# Patient Record
Sex: Female | Born: 1980 | Race: White | Hispanic: No | Marital: Married | Smoking: Never smoker
Health system: Southern US, Community
[De-identification: ages and names within clinical notes are randomized; demographics above are authoritative.]

## PROBLEM LIST (undated history)

## (undated) DIAGNOSIS — F419 Anxiety disorder, unspecified: Secondary | ICD-10-CM

## (undated) DIAGNOSIS — G4700 Insomnia, unspecified: Secondary | ICD-10-CM

## (undated) DIAGNOSIS — F32A Depression, unspecified: Secondary | ICD-10-CM

## (undated) DIAGNOSIS — F329 Major depressive disorder, single episode, unspecified: Secondary | ICD-10-CM

## (undated) DIAGNOSIS — Z8759 Personal history of other complications of pregnancy, childbirth and the puerperium: Secondary | ICD-10-CM

## (undated) HISTORY — PX: APPENDECTOMY: SHX54

## (undated) HISTORY — DX: Morbid (severe) obesity due to excess calories: E66.01

## (undated) HISTORY — DX: Depression, unspecified: F32.A

## (undated) HISTORY — PX: CHOLECYSTECTOMY: SHX55

## (undated) HISTORY — DX: Insomnia, unspecified: G47.00

## (undated) HISTORY — DX: Anxiety disorder, unspecified: F41.9

## (undated) HISTORY — DX: Major depressive disorder, single episode, unspecified: F32.9

## (undated) HISTORY — DX: Personal history of other complications of pregnancy, childbirth and the puerperium: Z87.59

---

## 1999-04-13 HISTORY — PX: WISDOM TOOTH EXTRACTION: SHX21

## 2008-04-12 HISTORY — PX: OOPHORECTOMY: SHX86

## 2011-03-29 ENCOUNTER — Emergency Department: Payer: Self-pay | Admitting: Emergency Medicine

## 2011-04-15 ENCOUNTER — Emergency Department: Payer: Self-pay | Admitting: Emergency Medicine

## 2011-11-02 ENCOUNTER — Ambulatory Visit (INDEPENDENT_AMBULATORY_CARE_PROVIDER_SITE_OTHER): Payer: BC Managed Care – PPO | Admitting: Obstetrics & Gynecology

## 2011-11-02 ENCOUNTER — Encounter: Payer: Self-pay | Admitting: Obstetrics & Gynecology

## 2011-11-02 VITALS — BP 101/80 | HR 84 | Ht 61.0 in | Wt 228.0 lb

## 2011-11-02 DIAGNOSIS — IMO0002 Reserved for concepts with insufficient information to code with codable children: Secondary | ICD-10-CM | POA: Insufficient documentation

## 2011-11-02 DIAGNOSIS — Z01419 Encounter for gynecological examination (general) (routine) without abnormal findings: Secondary | ICD-10-CM

## 2011-11-02 DIAGNOSIS — Z803 Family history of malignant neoplasm of breast: Secondary | ICD-10-CM

## 2011-11-02 DIAGNOSIS — Z1151 Encounter for screening for human papillomavirus (HPV): Secondary | ICD-10-CM

## 2011-11-02 DIAGNOSIS — Z3009 Encounter for other general counseling and advice on contraception: Secondary | ICD-10-CM

## 2011-11-02 DIAGNOSIS — Z124 Encounter for screening for malignant neoplasm of cervix: Secondary | ICD-10-CM

## 2011-11-02 NOTE — Patient Instructions (Signed)
Preventive Care for Adults, Female A healthy lifestyle and preventive care can promote health and wellness. Preventive health guidelines for women include the following key practices.  A routine yearly physical is a good way to check with your caregiver about your health and preventive screening. It is a chance to share any concerns and updates on your health, and to receive a thorough exam.   Visit your dentist for a routine exam and preventive care every 6 months. Brush your teeth twice a day and floss once a day. Good oral hygiene prevents tooth decay and gum disease.   The frequency of eye exams is based on your age, health, family medical history, use of contact lenses, and other factors. Follow your caregiver's recommendations for frequency of eye exams.   Eat a healthy diet. Foods like vegetables, fruits, whole grains, low-fat dairy products, and lean protein foods contain the nutrients you need without too many calories. Decrease your intake of foods high in solid fats, added sugars, and salt. Eat the right amount of calories for you.Get information about a proper diet from your caregiver, if necessary.   Regular physical exercise is one of the most important things you can do for your health. Most adults should get at least 150 minutes of moderate-intensity exercise (any activity that increases your heart rate and causes you to sweat) each week. In addition, most adults need muscle-strengthening exercises on 2 or more days a week.   Maintain a healthy weight. The body mass index (BMI) is a screening tool to identify possible weight problems. It provides an estimate of body fat based on height and weight. Your caregiver can help determine your BMI, and can help you achieve or maintain a healthy weight.For adults 20 years and older:   A BMI below 18.5 is considered underweight.   A BMI of 18.5 to 24.9 is normal.   A BMI of 25 to 29.9 is considered overweight.   A BMI of 30 and above is  considered obese.   Maintain normal blood lipids and cholesterol levels by exercising and minimizing your intake of saturated fat. Eat a balanced diet with plenty of fruit and vegetables. Blood tests for lipids and cholesterol should begin at age 20 and be repeated every 5 years. If your lipid or cholesterol levels are high, you are over 50, or you are at high risk for heart disease, you may need your cholesterol levels checked more frequently.Ongoing high lipid and cholesterol levels should be treated with medicines if diet and exercise are not effective.   If you smoke, find out from your caregiver how to quit. If you do not use tobacco, do not start.   If you are pregnant, do not drink alcohol. If you are breastfeeding, be very cautious about drinking alcohol. If you are not pregnant and choose to drink alcohol, do not exceed 1 drink per day. One drink is considered to be 12 ounces (355 mL) of beer, 5 ounces (148 mL) of wine, or 1.5 ounces (44 mL) of liquor.   Avoid use of street drugs. Do not share needles with anyone. Ask for help if you need support or instructions about stopping the use of drugs.   High blood pressure causes heart disease and increases the risk of stroke. Your blood pressure should be checked at least every 1 to 2 years. Ongoing high blood pressure should be treated with medicines if weight loss and exercise are not effective.   If you are 55 to 32   years old, ask your caregiver if you should take aspirin to prevent strokes.   Diabetes screening involves taking a blood sample to check your fasting blood sugar level. This should be done once every 3 years, after age 45, if you are within normal weight and without risk factors for diabetes. Testing should be considered at a younger age or be carried out more frequently if you are overweight and have at least 1 risk factor for diabetes.   Breast cancer screening is essential preventive care for women. You should practice "breast  self-awareness." This means understanding the normal appearance and feel of your breasts and may include breast self-examination. Any changes detected, no matter how small, should be reported to a caregiver. Women in their 20s and 30s should have a clinical breast exam (CBE) by a caregiver as part of a regular health exam every 1 to 3 years. After age 40, women should have a CBE every year. Starting at age 40, women should consider having a mammography (breast X-ray test) every year. Women who have a family history of breast cancer should talk to their caregiver about genetic screening. Women at a high risk of breast cancer should talk to their caregivers about having magnetic resonance imaging (MRI) and a mammography every year.   The Pap test is a screening test for cervical cancer. A Pap test can show cell changes on the cervix that might become cervical cancer if left untreated. A Pap test is a procedure in which cells are obtained and examined from the lower end of the uterus (cervix).   Women should have a Pap test starting at age 21.   Between ages 21 and 29, Pap tests should be repeated every 2 years.   Beginning at age 30, you should have a Pap test every 3 years as long as the past 3 Pap tests have been normal.   Some women have medical problems that increase the chance of getting cervical cancer. Talk to your caregiver about these problems. It is especially important to talk to your caregiver if a new problem develops soon after your last Pap test. In these cases, your caregiver may recommend more frequent screening and Pap tests.   The above recommendations are the same for women who have or have not gotten the vaccine for human papillomavirus (HPV).   If you had a hysterectomy for a problem that was not cancer or a condition that could lead to cancer, then you no longer need Pap tests. Even if you no longer need a Pap test, a regular exam is a good idea to make sure no other problems are  starting.   If you are between ages 65 and 70, and you have had normal Pap tests going back 10 years, you no longer need Pap tests. Even if you no longer need a Pap test, a regular exam is a good idea to make sure no other problems are starting.   If you have had past treatment for cervical cancer or a condition that could lead to cancer, you need Pap tests and screening for cancer for at least 20 years after your treatment.   If Pap tests have been discontinued, risk factors (such as a new sexual partner) need to be reassessed to determine if screening should be resumed.   The HPV test is an additional test that may be used for cervical cancer screening. The HPV test looks for the virus that can cause the cell changes on the cervix.   The cells collected during the Pap test can be tested for HPV. The HPV test could be used to screen women aged 30 years and older, and should be used in women of any age who have unclear Pap test results. After the age of 30, women should have HPV testing at the same frequency as a Pap test.   Colorectal cancer can be detected and often prevented. Most routine colorectal cancer screening begins at the age of 50 and continues through age 75. However, your caregiver may recommend screening at an earlier age if you have risk factors for colon cancer. On a yearly basis, your caregiver may provide home test kits to check for hidden blood in the stool. Use of a small camera at the end of a tube, to directly examine the colon (sigmoidoscopy or colonoscopy), can detect the earliest forms of colorectal cancer. Talk to your caregiver about this at age 50, when routine screening begins. Direct examination of the colon should be repeated every 5 to 10 years through age 75, unless early forms of pre-cancerous polyps or small growths are found.   Hepatitis C blood testing is recommended for all people born from 1945 through 1965 and any individual with known risks for hepatitis C.    Practice safe sex. Use condoms and avoid high-risk sexual practices to reduce the spread of sexually transmitted infections (STIs). STIs include gonorrhea, chlamydia, syphilis, trichomonas, herpes, HPV, and human immunodeficiency virus (HIV). Herpes, HIV, and HPV are viral illnesses that have no cure. They can result in disability, cancer, and death. Sexually active women aged 25 and younger should be checked for chlamydia. Older women with new or multiple partners should also be tested for chlamydia. Testing for other STIs is recommended if you are sexually active and at increased risk.   Osteoporosis is a disease in which the bones lose minerals and strength with aging. This can result in serious bone fractures. The risk of osteoporosis can be identified using a bone density scan. Women ages 65 and over and women at risk for fractures or osteoporosis should discuss screening with their caregivers. Ask your caregiver whether you should take a calcium supplement or vitamin D to reduce the rate of osteoporosis.   Menopause can be associated with physical symptoms and risks. Hormone replacement therapy is available to decrease symptoms and risks. You should talk to your caregiver about whether hormone replacement therapy is right for you.   Use sunscreen with sun protection factor (SPF) of 30 or more. Apply sunscreen liberally and repeatedly throughout the day. You should seek shade when your shadow is shorter than you. Protect yourself by wearing long sleeves, pants, a wide-brimmed hat, and sunglasses year round, whenever you are outdoors.   Once a month, do a whole body skin exam, using a mirror to look at the skin on your back. Notify your caregiver of new moles, moles that have irregular borders, moles that are larger than a pencil eraser, or moles that have changed in shape or color.   Stay current with required immunizations.   Influenza. You need a dose every fall (or winter). The composition of  the flu vaccine changes each year, so being vaccinated once is not enough.   Pneumococcal polysaccharide. You need 1 to 2 doses if you smoke cigarettes or if you have certain chronic medical conditions. You need 1 dose at age 65 (or older) if you have never been vaccinated.   Tetanus, diphtheria, pertussis (Tdap, Td). Get 1 dose of   Tdap vaccine if you are younger than age 65, are over 65 and have contact with an infant, are a healthcare worker, are pregnant, or simply want to be protected from whooping cough. After that, you need a Td booster dose every 10 years. Consult your caregiver if you have not had at least 3 tetanus and diphtheria-containing shots sometime in your life or have a deep or dirty wound.   HPV. You need this vaccine if you are a woman age 26 or younger. The vaccine is given in 3 doses over 6 months.   Measles, mumps, rubella (MMR). You need at least 1 dose of MMR if you were born in 1957 or later. You may also need a second dose.   Meningococcal. If you are age 19 to 21 and a first-year college student living in a residence hall, or have one of several medical conditions, you need to get vaccinated against meningococcal disease. You may also need additional booster doses.   Zoster (shingles). If you are age 60 or older, you should get this vaccine.   Varicella (chickenpox). If you have never had chickenpox or you were vaccinated but received only 1 dose, talk to your caregiver to find out if you need this vaccine.   Hepatitis A. You need this vaccine if you have a specific risk factor for hepatitis A virus infection or you simply wish to be protected from this disease. The vaccine is usually given as 2 doses, 6 to 18 months apart.   Hepatitis B. You need this vaccine if you have a specific risk factor for hepatitis B virus infection or you simply wish to be protected from this disease. The vaccine is given in 3 doses, usually over 6 months.  Preventive Services /  Frequency Ages 19 to 39  Blood pressure check.** / Every 1 to 2 years.   Lipid and cholesterol check.** / Every 5 years beginning at age 20.   Clinical breast exam.** / Every 3 years for women in their 20s and 30s.   Pap test.** / Every 2 years from ages 21 through 29. Every 3 years starting at age 30 through age 65 or 70 with a history of 3 consecutive normal Pap tests.   HPV screening.** / Every 3 years from ages 30 through ages 65 to 70 with a history of 3 consecutive normal Pap tests.   Hepatitis C blood test.** / For any individual with known risks for hepatitis C.   Skin self-exam. / Monthly.   Influenza immunization.** / Every year.   Pneumococcal polysaccharide immunization.** / 1 to 2 doses if you smoke cigarettes or if you have certain chronic medical conditions.   Tetanus, diphtheria, pertussis (Tdap, Td) immunization. / A one-time dose of Tdap vaccine. After that, you need a Td booster dose every 10 years.   HPV immunization. / 3 doses over 6 months, if you are 26 and younger.   Measles, mumps, rubella (MMR) immunization. / You need at least 1 dose of MMR if you were born in 1957 or later. You may also need a second dose.   Meningococcal immunization. / 1 dose if you are age 19 to 21 and a first-year college student living in a residence hall, or have one of several medical conditions, you need to get vaccinated against meningococcal disease. You may also need additional booster doses.   Varicella immunization.** / Consult your caregiver.   Hepatitis A immunization.** / Consult your caregiver. 2 doses, 6 to 18 months   apart.   Hepatitis B immunization.** / Consult your caregiver. 3 doses usually over 6 months.    ** Family history and personal history of risk and conditions may change your caregiver's recommendations. Document Released: 05/25/2001 Document Revised: 03/18/2011 Document Reviewed: 08/24/2010 [employer] Patient Information 2012 [employer]. 

## 2011-11-02 NOTE — Progress Notes (Signed)
Patient is here for routine yearly exam, she is interested BRCA and birth control.

## 2011-11-02 NOTE — Progress Notes (Signed)
  Subjective:     Karen Bolton is a 31 y.o. G0 female and is here for a comprehensive gynecologic physical exam. The patient reports no problems. Wanted to know if she can get a mammogram given extensive FH of breast cancer, mother diagnosed at age 67, cousins in their 39s. Also wants Mirena IUD for contraception.  History   Social History  . Marital Status: Married    Spouse Name: N/A    Number of Children: N/A  . Years of Education: N/A   Occupational History  . Not on file.   Social History Main Topics  . Smoking status: Never Smoker   . Smokeless tobacco: Not on file  . Alcohol Use: Yes     occasionally  . Drug Use: No  . Sexually Active: Yes -- Female partner(s)    Birth Control/ Protection: Condom   Other Topics Concern  . Not on file   Social History Narrative  . No narrative on file   No health maintenance topics applied.  The following portions of the patient's history were reviewed and updated as appropriate: allergies, current medications, past family history, past medical history, past social history, past surgical history and problem list.  Review of Systems A comprehensive review of systems was negative.   Objective:   Blood pressure 101/80, pulse 84, height 5\' 1"  (1.549 m), weight 228 lb (103.42 kg), last menstrual period 09/28/2011. GENERAL: Well-developed, well-nourished female in no acute distress.  HEENT: Normocephalic, atraumatic. Sclerae anicteric.  NECK: Supple. Normal thyroid.  LUNGS: Clear to auscultation bilaterally.  HEART: Regular rate and rhythm. BREASTS: Symmetric, pendulous with everted nipples. No masses, skin changes, nipple drainage, or lymphadenopathy. ABDOMEN: Soft, nontender, nondistended. No organomegaly. PELVIC: Normal external female genitalia. Vagina is pink and rugated.  Slow trickle of blood notes, patient due for her menstrual period. Normal cervix contour. Pap smear obtained. Uterus is normal in size. No adnexal mass or  tenderness.  EXTREMITIES: No cyanosis, clubbing, or edema, 2+ distal pulses.   Assessment:    Healthy female exam.      Plan:    Follow up pap smear [facility], not due for mammogram until age 47.  She filled out paperwork for genetic testing, will follow up and manage accordingly Will refer to [facility] for primary care Return this week for Mirena IUD insertion Routine preventative health maintenance measures emphasized

## 2011-11-04 ENCOUNTER — Encounter: Payer: Self-pay | Admitting: Obstetrics & Gynecology

## 2011-11-04 ENCOUNTER — Ambulatory Visit (INDEPENDENT_AMBULATORY_CARE_PROVIDER_SITE_OTHER): Payer: BC Managed Care – PPO | Admitting: Obstetrics & Gynecology

## 2011-11-04 VITALS — BP 93/69 | HR 77 | Ht 61.0 in | Wt 227.0 lb

## 2011-11-04 DIAGNOSIS — Z30430 Encounter for insertion of intrauterine contraceptive device: Secondary | ICD-10-CM

## 2011-11-04 DIAGNOSIS — Z01812 Encounter for preprocedural laboratory examination: Secondary | ICD-10-CM

## 2011-11-04 DIAGNOSIS — Z975 Presence of (intrauterine) contraceptive device: Secondary | ICD-10-CM | POA: Insufficient documentation

## 2011-11-04 LAB — POCT URINE PREGNANCY: Preg Test, Ur: NEGATIVE

## 2011-11-04 NOTE — Progress Notes (Signed)
Patient is here for insertion of Mirena IUD and BRCA testing due to strong family history of breast and ovarian cancer.

## 2011-11-04 NOTE — Progress Notes (Signed)
IUD Procedure Note Patient identified, informed consent performed.  Discussed risks of irregular bleeding, cramping, infection, malpositioning or misplacement of the IUD outside the uterus which may require further procedures. Time out was performed.  Urine pregnancy test negative.  Speculum placed in the vagina.  Cervix visualized.  Cleaned with Betadine x 2.  Grasped anteriorly with a single tooth tenaculum.  Uterus sounded to7 cm.  Mirena IUD placed per manufacturer's recommendations.  Strings trimmed to 3 cm. Tenaculum was removed, good hemostasis noted.  Patient tolerated procedure well.   Patient was given post-procedure instructions and the Mirena care card with expiration date.  Patient was also asked to check IUD strings periodically and follow up in 4-6 weeks for IUD check.  Finian Helvey L. Harraway-Smith, M.D., [facility]

## 2011-11-04 NOTE — Patient Instructions (Signed)

## 2011-11-10 ENCOUNTER — Encounter: Payer: Self-pay | Admitting: Obstetrics & Gynecology

## 2011-11-11 ENCOUNTER — Emergency Department: Payer: Self-pay | Admitting: Emergency Medicine

## 2011-12-02 ENCOUNTER — Ambulatory Visit: Payer: BC Managed Care – PPO | Admitting: Obstetrics & Gynecology

## 2012-01-29 ENCOUNTER — Emergency Department: Payer: Self-pay | Admitting: Emergency Medicine

## 2012-01-31 ENCOUNTER — Emergency Department: Payer: Self-pay | Admitting: Emergency Medicine

## 2012-03-08 ENCOUNTER — Emergency Department: Payer: Self-pay | Admitting: Emergency Medicine

## 2012-03-16 ENCOUNTER — Ambulatory Visit: Payer: BC Managed Care – PPO | Admitting: Obstetrics & Gynecology

## 2012-03-18 ENCOUNTER — Encounter (HOSPITAL_COMMUNITY): Payer: Self-pay | Admitting: Emergency Medicine

## 2012-03-18 ENCOUNTER — Emergency Department (HOSPITAL_COMMUNITY)
Admission: EM | Admit: 2012-03-18 | Discharge: 2012-03-18 | Disposition: A | Discharge status: Home / Self Care | Payer: BC Managed Care – PPO | Attending: Emergency Medicine | Admitting: Emergency Medicine

## 2012-03-18 DIAGNOSIS — K0889 Other specified disorders of teeth and supporting structures: Secondary | ICD-10-CM

## 2012-03-18 DIAGNOSIS — K089 Disorder of teeth and supporting structures, unspecified: Secondary | ICD-10-CM | POA: Insufficient documentation

## 2012-03-18 DIAGNOSIS — Z79899 Other long term (current) drug therapy: Secondary | ICD-10-CM | POA: Insufficient documentation

## 2012-03-18 DIAGNOSIS — Z87898 Personal history of other specified conditions: Secondary | ICD-10-CM | POA: Insufficient documentation

## 2012-03-18 MED ORDER — CLINDAMYCIN HCL 150 MG PO CAPS: 150.0000 mg | ORAL_CAPSULE | Freq: Three times a day (TID) | ORAL | Status: DC

## 2012-03-18 MED ORDER — OXYCODONE-ACETAMINOPHEN 5-325 MG PO TABS: 1.0000 | ORAL_TABLET | ORAL | Status: DC | PRN

## 2012-03-18 NOTE — ED Notes (Signed)
Pt. Stated, I've had a toothache since last night.

## 2012-03-18 NOTE — ED Provider Notes (Signed)
History     CSN: [id number]  Arrival date & time 03/18/12  1218   First MD Initiated Contact with Patient 03/18/12 1236      Chief Complaint  Patient presents with  . Dental Pain    (Consider location/radiation/quality/duration/timing/severity/associated sxs/prior treatment) HPI Comments: Pt states that she broke her tooth last night and now she is having a lot of left upper dental pain  Patient is a 31 y.o. female presenting with tooth pain. No language interpreter was used.  Dental PainThe primary symptoms include mouth pain. The symptoms began 12 to 24 hours ago. The symptoms are unchanged. The symptoms occur constantly.    Past Medical History  Diagnosis Date  . Insomnia     Past Surgical History  Procedure Date  . Oophorectomy 2010    right ovary removed    Family History  Problem Relation Age of Onset  . Cancer Mother 42    Breast Cancer  . Cancer Maternal Aunt 35    ovarian Cancer  . Cancer Maternal Uncle     pancreatic cancer    History  Substance Use Topics  . Smoking status: Never Smoker   . Smokeless tobacco: Not on file  . Alcohol Use: Yes     Comment: occasionally    OB History    Grav Para Term Preterm Abortions TAB SAB Ect Mult Living                  Review of Systems  Constitutional: Negative.   Respiratory: Negative.   Cardiovascular: Negative.     Allergies  Penicillins and Dilaudid  Home Medications   Current Outpatient Rx  Name  Route  Sig  Dispense  Refill  . CARBAMAZEPINE 100 MG PO CHEW   Oral   Chew 100 mg by mouth 2 (two) times daily.         Marland Kitchen CLONAZEPAM 1 MG PO TABS   Oral   Take 1 mg by mouth 2 (two) times daily.         . QUETIAPINE FUMARATE 100 MG PO TABS   Oral   Take 100 mg by mouth at bedtime.           BP 114/59  Pulse 90  Temp 97.5 F (36.4 C) (Oral)  Resp 16  SpO2 98%  LMP 03/09/2012  Physical Exam  Nursing note and vitals reviewed. Constitutional: She appears well-developed and  well-nourished.  HENT:       Pt has fracture noted to left upper dentition:no swelling noted  Cardiovascular: Normal rate and regular rhythm.   Pulmonary/Chest: Effort normal and breath sounds normal.    ED Course  Procedures (including critical care time)  Labs Reviewed - No data to display No results found.   1. Toothache       MDM  Pt given antibiotic and pain medication and referral to dentist        Teressa Lower, NP 03/18/12 1252

## 2012-03-19 ENCOUNTER — Emergency Department: Payer: Self-pay | Admitting: Emergency Medicine

## 2012-03-19 LAB — URINALYSIS, COMPLETE
Bilirubin,UR: NEGATIVE
Glucose,UR: NEGATIVE mg/dL (ref 0–75)
Leukocyte Esterase: NEGATIVE
Nitrite: NEGATIVE
RBC,UR: 16 /HPF (ref 0–5)

## 2012-03-19 LAB — CBC
Platelet: 258 10*3/uL (ref 150–440)
RBC: 4.71 10*6/uL (ref 3.80–5.20)

## 2012-03-19 LAB — COMPREHENSIVE METABOLIC PANEL
Anion Gap: 5 — ABNORMAL LOW (ref 7–16)
BUN: 13 mg/dL (ref 7–18)
Bilirubin,Total: 0.5 mg/dL (ref 0.2–1.0)
Chloride: 107 mmol/L (ref 98–107)
Creatinine: 0.81 mg/dL (ref 0.60–1.30)
EGFR (Non-African Amer.): >60
Osmolality: 276 (ref 275–301)
Potassium: 3.8 mmol/L (ref 3.5–5.1)
SGOT(AST): 28 U/L (ref 15–37)
Sodium: 138 mmol/L (ref 136–145)
Total Protein: 8.1 g/dL (ref 6.4–8.2)

## 2012-03-19 LAB — DRUG SCREEN, URINE
Barbiturates, Ur Screen: NEGATIVE (ref ?–200)
Benzodiazepine, Ur Scrn: NEGATIVE (ref ?–200)
MDMA (Ecstasy)Ur Screen: NEGATIVE (ref ?–500)
Methadone, Ur Screen: NEGATIVE (ref ?–300)
Opiate, Ur Screen: NEGATIVE (ref ?–300)

## 2012-03-19 LAB — ACETAMINOPHEN LEVEL: Acetaminophen: <2 ug/mL

## 2012-03-19 LAB — ETHANOL: Ethanol %: <0.003 % (ref 0.000–0.080)

## 2012-03-19 LAB — PREGNANCY, URINE: Pregnancy Test, Urine: NEGATIVE m[IU]/mL

## 2012-03-19 NOTE — ED Provider Notes (Signed)
Medical screening examination/treatment/procedure(s) were performed by non-physician practitioner and as supervising physician I was immediately available for consultation/collaboration.   Laray Anger, DO 03/19/12 1249

## 2012-03-21 ENCOUNTER — Emergency Department: Payer: Self-pay | Admitting: Emergency Medicine

## 2012-03-21 ENCOUNTER — Ambulatory Visit: Payer: BC Managed Care – PPO | Admitting: Obstetrics & Gynecology

## 2012-03-21 DIAGNOSIS — Z30431 Encounter for routine checking of intrauterine contraceptive device: Secondary | ICD-10-CM

## 2012-03-21 LAB — URINALYSIS, COMPLETE
Bilirubin,UR: NEGATIVE
Glucose,UR: NEGATIVE mg/dL (ref 0–75)
Leukocyte Esterase: NEGATIVE
RBC,UR: <1 /HPF (ref 0–5)
Squamous Epithelial: 2
WBC UR: 1 /HPF (ref 0–5)

## 2012-04-15 ENCOUNTER — Emergency Department: Payer: Self-pay | Admitting: Emergency Medicine

## 2012-04-15 LAB — URINALYSIS, COMPLETE
Bilirubin,UR: NEGATIVE
Leukocyte Esterase: NEGATIVE
Nitrite: NEGATIVE
WBC UR: <1 /HPF (ref 0–5)

## 2012-05-05 ENCOUNTER — Ambulatory Visit: Payer: BC Managed Care – PPO | Admitting: Family Medicine

## 2012-05-09 ENCOUNTER — Ambulatory Visit (INDEPENDENT_AMBULATORY_CARE_PROVIDER_SITE_OTHER): Payer: BC Managed Care – PPO | Admitting: Obstetrics & Gynecology

## 2012-05-09 ENCOUNTER — Encounter: Payer: Self-pay | Admitting: Obstetrics & Gynecology

## 2012-05-09 VITALS — BP 110/77 | HR 82 | Ht 61.0 in | Wt 227.0 lb

## 2012-05-09 DIAGNOSIS — Z30432 Encounter for removal of intrauterine contraceptive device: Secondary | ICD-10-CM

## 2012-05-09 DIAGNOSIS — Z3169 Encounter for other general counseling and advice on procreation: Secondary | ICD-10-CM

## 2012-05-09 DIAGNOSIS — Z975 Presence of (intrauterine) contraceptive device: Secondary | ICD-10-CM

## 2012-05-09 DIAGNOSIS — Z803 Family history of malignant neoplasm of breast: Secondary | ICD-10-CM

## 2012-05-09 DIAGNOSIS — IMO0002 Reserved for concepts with insufficient information to code with codable children: Secondary | ICD-10-CM

## 2012-05-09 DIAGNOSIS — Z719 Counseling, unspecified: Secondary | ICD-10-CM

## 2012-05-09 MED ORDER — PRENATAL CA CARB-B6-B12-FA 1 MG PO TABS: 1.0000 | ORAL_TABLET | Freq: Every day | ORAL | Status: DC

## 2012-05-09 NOTE — Patient Instructions (Signed)
Preparing for Pregnancy  Preparing for pregnancy (preconceptual care) by getting counseling and information from your caregiver before getting pregnant is a good idea. It will help you and your baby have a better chance to have a healthy, safe pregnancy and delivery of your baby. Make an appointment with your caregiver to talk about your health, medical, and family history and how to prepare yourself before getting pregnant. Your caregiver will do a complete physical exam and a Pap test. They will want to know:  · About you, your spouse or partner, and your family's medical and genetic history.  · If you are eating a balanced diet and drinking enough fluids.  · What vitamins and mineral supplements you are taking. This includes taking folic acid before getting pregnant to help prevent birth defects.  · What medications you are taking including prescription, over-the-counter and herbal medications.  · If there is any substance abuse like alcohol, smoking, and illegal drugs.  · If there is any mental or physical domestic violence.  · If there is any risk of sexually transmitted disease between you and your partner.  · What immunizations and vaccinations you have had and what you may need before getting pregnant.  · If you should get tested for HIV infection.  · If there is any exposure to chemical or toxic substances at home or work.  · If there are medical problems you have that need to be treated and kept under control before getting pregnant such as diabetes, high blood pressure or others.  · If there were any past surgeries, pregnancies and problems with them.  · What your current weight is and to set a goal as to how much weight you should gain while pregnant. Also, they will check if you should lose or gain weight before getting pregnant.  · What is your exercise routine and what it is safe when you are pregnant.  · If there are any physical disabilities that need to be addressed.  · About spacing your  pregnancies when there are other children.  · If there is a financial problem that may affect you having a child.  After talking about the above points with your caregiver, your caregiver will give you advice on how to help treat and work with you on solving any issues, if necessary, before getting pregnant. The goal is to have a healthy and safe pregnancy for you and your baby. You should keep an accurate record of your menstrual periods because it will help in determining your due date.  Immunizations that you should have before getting pregnant:   · Regular measles, German measles (rubella) and mumps.  · Tetanus and diphtheria.  · Chickenpox, if not immune.  · Herpes zoster (Varicella) if not immune.  · Human papilloma virus vaccine (HPV) between the age of 9 and 26 years old.  · Hepatitis A vaccine.  · Hepatitis B vaccine.  · Influenza vaccine.  · Pneumococcal vaccine (pneumonia).  You should avoid getting pregnant for one month after getting vaccinated with a live virus vaccine such as German measles (rubella) vaccine. Other immunizations may be necessary depending on where you live, such as malaria. Ask your caregiver if any other immunizations are needed for you.  HOME CARE INSTRUCTIONS   · Follow the advice of your caregiver.  · Before getting pregnant:  · Begin taking vitamins, supplements, and 0.4 milligrams folic acid daily.  · Get your immunizations up-to-date.  · Get help from a nutrition counselor   if you do not understand what a balanced diet is, need help with a special medical diet or if you need help to lose or gain weight.  · Begin exercising.  · Stop smoking, taking illegal drugs, and drinking alcoholic beverages.  · Get counseling if there is and type of domestic violence.  · Get checked for sexually transmitted diseases including HIV.  · Get any medical problems under control (diabetes, high blood pressure, convulsions, asthma or others).  · Resolve any financial concerns.  · Be sure you and  your spouse or partner are ready to have a baby.  · Keep an accurate record of your menstrual periods.  Document Released: 03/11/2008 Document Revised: 06/21/2011 Document Reviewed: 03/11/2008  [employer]® Patient Information ©2013 [employer].

## 2012-05-09 NOTE — Progress Notes (Signed)
Karen Bolton is a 32 y.o. G2P0020 here for Mirena IUD removal. No GYN concerns.  Last pap smear was on 11/09/12 and was normal but positive HPV.   BP 110/77  Pulse 82  Ht 5\' 1"  (1.549 m)  Wt 227 lb (102.967 kg)  BMI 42.89 kg/m2  LMP 05/01/2012 IUD Removal  Patient was placed in the dorsal lithotomy position, normal external genitalia was noted.  A speculum was placed in the patient's vagina, normal discharge was noted, no lesions. The nulliparous cervix was visualized, no lesions, no abnormal discharge. The strings of the IUD were grasped and pulled using ring forceps.  The IUD was successfully removed in its entirety. Patient tolerated the procedure well.    Patient wants to conceive soon. She is on various psychotropic medications and is under the care of a psychiatrist who will modify/discontinue these medications when she gets pregnant.  Preconceptual connseling given, also prescribed PNV. She was told it can take up to six months after Mirena removal to conceive, if no conception after that, she was told to use ovulation predictor kits or come in for further evaluation.  Patient reports extensive family history of breast cancer, including her mother who was diagnosed at age 73.  Genetic counseling about breast cancer given, patient interested in getting screening.  She will obtain the test today.  Total encounter time: 25 minutes

## 2012-05-12 ENCOUNTER — Ambulatory Visit (INDEPENDENT_AMBULATORY_CARE_PROVIDER_SITE_OTHER): Payer: BC Managed Care – PPO | Admitting: Family Medicine

## 2012-05-12 VITALS — BP 124/80 | Ht 61.0 in | Wt 227.0 lb

## 2012-05-12 DIAGNOSIS — M549 Dorsalgia, unspecified: Secondary | ICD-10-CM

## 2012-05-12 DIAGNOSIS — G8929 Other chronic pain: Secondary | ICD-10-CM
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
## 2012-05-19 ENCOUNTER — Emergency Department (HOSPITAL_COMMUNITY)
Admission: EM | Admit: 2012-05-19 | Discharge: 2012-05-20 | Disposition: A | Discharge status: Home / Self Care | Payer: BC Managed Care – PPO | Attending: Emergency Medicine | Admitting: Emergency Medicine

## 2012-05-19 DIAGNOSIS — E669 Obesity, unspecified: Secondary | ICD-10-CM | POA: Insufficient documentation

## 2012-05-19 DIAGNOSIS — Z79899 Other long term (current) drug therapy: Secondary | ICD-10-CM | POA: Insufficient documentation

## 2012-05-19 DIAGNOSIS — F329 Major depressive disorder, single episode, unspecified: Secondary | ICD-10-CM | POA: Insufficient documentation

## 2012-05-19 DIAGNOSIS — Y939 Activity, unspecified: Secondary | ICD-10-CM | POA: Insufficient documentation

## 2012-05-19 DIAGNOSIS — Y929 Unspecified place or not applicable: Secondary | ICD-10-CM | POA: Insufficient documentation

## 2012-05-19 DIAGNOSIS — F3289 Other specified depressive episodes: Secondary | ICD-10-CM | POA: Insufficient documentation

## 2012-05-19 DIAGNOSIS — Z8742 Personal history of other diseases of the female genital tract: Secondary | ICD-10-CM | POA: Insufficient documentation

## 2012-05-19 DIAGNOSIS — S53124A Posterior dislocation of right ulnohumeral joint, initial encounter: Secondary | ICD-10-CM

## 2012-05-19 DIAGNOSIS — Z87898 Personal history of other specified conditions: Secondary | ICD-10-CM | POA: Insufficient documentation

## 2012-05-19 DIAGNOSIS — S53106A Unspecified dislocation of unspecified ulnohumeral joint, initial encounter: Secondary | ICD-10-CM | POA: Insufficient documentation

## 2012-05-19 DIAGNOSIS — W19XXXA Unspecified fall, initial encounter: Secondary | ICD-10-CM | POA: Insufficient documentation

## 2012-05-20 ENCOUNTER — Emergency Department (HOSPITAL_COMMUNITY): Payer: BC Managed Care – PPO

## 2012-05-20 MED ORDER — ONDANSETRON HCL 4 MG/2ML IJ SOLN
INTRAMUSCULAR | Status: AC
  Filled 2012-05-20: qty 2

## 2012-05-20 MED ORDER — PROPOFOL 10 MG/ML IV BOLUS
INTRAVENOUS | Status: AC
  Filled 2012-05-20: qty 20

## 2012-05-20 MED ORDER — OXYCODONE-ACETAMINOPHEN 5-325 MG PO TABS: 1.0000 | ORAL_TABLET | ORAL | Status: DC | PRN

## 2012-05-20 MED ORDER — FENTANYL CITRATE 0.05 MG/ML IJ SOLN
INTRAMUSCULAR | Status: AC
  Filled 2012-05-20: qty 2

## 2012-05-20 MED ORDER — KETOROLAC TROMETHAMINE 30 MG/ML IJ SOLN
30.0000 mg | Freq: Once | INTRAMUSCULAR | Status: AC
  Administered 2012-05-20: 30 mg via INTRAVENOUS
  Filled 2012-05-20: qty 1

## 2012-05-20 MED ORDER — LORAZEPAM 2 MG/ML IJ SOLN
1.0000 mg | Freq: Once | INTRAMUSCULAR | Status: AC
  Administered 2012-05-20: 1 mg via INTRAVENOUS
  Filled 2012-05-20: qty 1

## 2012-05-20 MED ORDER — ONDANSETRON HCL 4 MG/2ML IJ SOLN
INTRAMUSCULAR | Status: AC
  Administered 2012-05-20: 4 mg
  Filled 2012-05-20: qty 2

## 2012-05-20 MED ORDER — FENTANYL CITRATE 0.05 MG/ML IJ SOLN
50.0000 ug | Freq: Once | INTRAMUSCULAR | Status: AC
  Administered 2012-05-20: 50 ug via INTRAVENOUS
  Filled 2012-05-20: qty 2

## 2012-05-20 NOTE — ED Notes (Signed)
Previous paper charting done during downtime.

## 2012-05-20 NOTE — ED Notes (Signed)
Given Rx x1, out to d/c desk, out with RN to car, family x3 present, alert, NAD,calm, interactive, VSS.

## 2012-05-20 NOTE — ED Notes (Addendum)
Pt alert, NAD, calm, interactive, resps e/u, speaking in clear complete sentences, coughs on command, swallows water w/o difficulty, speakking with pharm tech at this time, meds given IV for pain, spasms, nausea and inflammation. States, "ready to go home". R foot IV site Unremarkable, cath intact.

## 2012-05-20 NOTE — ED Provider Notes (Signed)
History    32 year old female with right elbow pain. Patient sustained a mechanical fall at home onto her right elbow. Onset shortly before arrival. Acute onset of pain. Constant pain since then. No other complaints. Does not think she hit her head. Denies headache, neck or back pain. No numbness, tingling or loss of strength.   CSN: [id number]  Arrival date & time 05/19/12  2352   First MD Initiated Contact with Patient 05/20/12 0457      No chief complaint on file.   (Consider location/radiation/quality/duration/timing/severity/associated sxs/prior treatment) HPI  Past Medical History  Diagnosis Date  . Insomnia   . History of miscarriage     x2  . Anxiety   . Depression     Past Surgical History  Procedure Laterality Date  . Oophorectomy  2010    right ovary removed  . Cholecystectomy    . Wisdom tooth extraction      Family History  Problem Relation Age of Onset  . Cancer Mother 14    Breast Cancer  . Cancer Maternal Aunt 35    ovarian Cancer  . Cancer Maternal Uncle     pancreatic cancer  . Heart disease Father   . Diabetes Father   . Sleep apnea Father   . Cancer Paternal Grandmother     breast cancer  . Cancer Cousin     breast cancer    History  Substance Use Topics  . Smoking status: Never Smoker   . Smokeless tobacco: Not on file  . Alcohol Use: Yes     Comment: rare.    OB History   Grav Para Term Preterm Abortions TAB SAB Ect Mult Living   2 0 0 0 2 0 2 0 0 0       Review of Systems  All systems reviewed and negative, other than as noted in HPI.  Allergies  Penicillins and Dilaudid  Home Medications   Current Outpatient Rx  Name  Route  Sig  Dispense  Refill  . amphetamine-dextroamphetamine (ADDERALL XR) 25 MG 24 hr capsule               . clonazePAM (KLONOPIN) 1 MG tablet   Oral   Take 1 mg by mouth 2 (two) times daily as needed (usually to help with sleep).          . Prenatal Ca Carb-B6-B12-FA 1 MG TABS   Oral  Take 1 tablet (1 mg total) by mouth daily.   30 each   12   . QUEtiapine (SEROQUEL) 100 MG tablet   Oral   Take 100 mg by mouth at bedtime.         Marland Kitchen oxyCODONE-acetaminophen (PERCOCET/ROXICET) 5-325 MG per tablet   Oral   Take 1-2 tablets by mouth every 4 (four) hours as needed for pain.   20 tablet   0     BP 133/93  Pulse 116  Temp(Src) 98.1 F (36.7 C) (Oral)  Resp 18  SpO2 97%  LMP 05/01/2012  Physical Exam  Nursing note and vitals reviewed. Constitutional: She appears well-developed and well-nourished. No distress.  HENT:  Head: Normocephalic and atraumatic.  Eyes: Conjunctivae are normal. Right eye exhibits no discharge. Left eye exhibits no discharge.  Neck: Neck supple.  Cardiovascular: Normal rate, regular rhythm and normal heart sounds.  Exam reveals no gallop and no friction rub.   No murmur heard. Pulmonary/Chest: Effort normal and breath sounds normal. No respiratory distress.  Abdominal: Soft. She exhibits no distension.  There is no tenderness.  Musculoskeletal: She exhibits no edema and no tenderness.  Diffuse tenderness of the right elbow. Olecranon appears to be felt posteriorly displaced on palpation. Patient unable to range her elbow secondary to pain. Neurovascular intact distally. No abrasions or lacerations.  Neurological: She is alert.  Skin: Skin is warm and dry.  Psychiatric: She has a normal mood and affect. Her behavior is normal. Thought content normal.    ED Course  Reduction of dislocation Date/Time: 05/20/2012 7:56 AM Performed by: Raeford Razor Authorized by: Raeford Razor Consent: Verbal consent obtained. Risks and benefits: risks, benefits and alternatives were discussed Consent given by: patient Required items: required blood products, implants, devices, and special equipment available Patient identity confirmed: verbally with patient, provided demographic data and arm band Time out: Immediately prior to procedure a "time out"  was called to verify the correct patient, procedure, equipment, support staff and site/side marked as required. Local anesthesia used: no Patient sedated: yes Sedation type: moderate (conscious) sedation Sedatives: propofol and ketamine Analgesia: fentanyl Vitals: Vital signs were monitored during sedation. Patient tolerance: Patient tolerated the procedure well with no immediate complications. Comments: Closed reduction of R posterior elbow dislocation.   Procedural sedation Date/Time: 05/20/2012 3:00 AM Performed by: Raeford Razor Authorized by: Raeford Razor Consent: written consent obtained. Risks and benefits: risks, benefits and alternatives were discussed Required items: required blood products, implants, devices, and special equipment available Patient identity confirmed: verbally with patient, arm band and provided demographic data Time out: Immediately prior to procedure a "time out" was called to verify the correct patient, procedure, equipment, support staff and site/side marked as required. Local anesthesia used: no Patient sedated: yes Sedation type: moderate (conscious) sedation Sedatives: propofol and ketamine Analgesia: fentanyl Vitals: Vital signs were monitored during sedation. Patient tolerance: Patient tolerated the procedure well with no immediate complications. Comments: Approximately 35 minutes of sedation time/monitoring   Preprocedure  Pre-anesthesia/induction confirmation of laterality/correct procedure site including "time-out."  Provider confirms review of the nurses' note, allergies, medications, pertinent labs, PMH, pre-induction vital signs, pulse oximetry, pain level, and ECG (as applicable), and patient condition satisfactory for commencing with order for sedation and procedure.  Patient tolerated procedure and procedural sedation component as expected without apparent immediate complications.  Physician confirms procedural medication orders as  administered, patient was assessed by physician post-procedure, and confirms post-sedation plan of care and disposition.   (including critical care time)  Labs Reviewed - No data to display No results found.   1. Dislocation of elbow, posterior, right, closed       MDM  32 year old female with right elbow pain. Imaging significant for a posterior right elbow dislocation. Closed injury. This was reduced satisfactorily. Neurovascular intact post procedure. Patient was placed in a sling. As necessary pain medication. Orthopedic followup.        Raeford Razor, MD 05/20/12 [phone]

## 2012-05-22 ENCOUNTER — Telehealth (HOSPITAL_COMMUNITY): Payer: Self-pay | Admitting: Emergency Medicine

## 2012-07-21 ENCOUNTER — Emergency Department: Payer: Self-pay | Admitting: Emergency Medicine

## 2012-07-27 ENCOUNTER — Emergency Department: Payer: Self-pay | Admitting: Emergency Medicine

## 2012-07-27 LAB — COMPREHENSIVE METABOLIC PANEL
Albumin: 3.7 g/dL (ref 3.4–5.0)
Anion Gap: 5 — ABNORMAL LOW (ref 7–16)
BUN: 17 mg/dL (ref 7–18)
Calcium, Total: 9.3 mg/dL (ref 8.5–10.1)
Chloride: 107 mmol/L (ref 98–107)
Co2: 25 mmol/L (ref 21–32)
Creatinine: 0.63 mg/dL (ref 0.60–1.30)
EGFR (African American): >60
EGFR (Non-African Amer.): >60
SGOT(AST): 36 U/L (ref 15–37)
Total Protein: 8 g/dL (ref 6.4–8.2)

## 2012-07-27 LAB — HCG, QUANTITATIVE, PREGNANCY: Beta Hcg, Quant.: <1 m[IU]/mL — ABNORMAL LOW

## 2012-07-27 LAB — CBC
HGB: 13.9 g/dL (ref 12.0–16.0)
MCHC: 33.9 g/dL (ref 32.0–36.0)
Platelet: 324 10*3/uL (ref 150–440)

## 2012-07-27 LAB — URINALYSIS, COMPLETE
Bacteria: NONE SEEN
Ketone: NEGATIVE
Ph: 5 (ref 4.5–8.0)
Protein: NEGATIVE
Squamous Epithelial: 18
WBC UR: 2 /HPF (ref 0–5)

## 2012-07-31 ENCOUNTER — Encounter: Payer: Self-pay | Admitting: *Deleted

## 2012-08-01 ENCOUNTER — Ambulatory Visit: Payer: BC Managed Care – PPO | Admitting: Family Medicine

## 2012-08-03 ENCOUNTER — Ambulatory Visit: Payer: BC Managed Care – PPO | Admitting: Obstetrics & Gynecology

## 2012-08-03 DIAGNOSIS — N83209 Unspecified ovarian cyst, unspecified side: Secondary | ICD-10-CM

## 2012-08-10 ENCOUNTER — Emergency Department: Payer: Self-pay | Admitting: Emergency Medicine

## 2012-08-10 LAB — URINALYSIS, COMPLETE
Bacteria: NONE SEEN
Ketone: NEGATIVE
Leukocyte Esterase: NEGATIVE
Nitrite: NEGATIVE
Ph: 6 (ref 4.5–8.0)
RBC,UR: 19 /HPF (ref 0–5)
Squamous Epithelial: 3
WBC UR: 1 /HPF (ref 0–5)

## 2012-08-10 LAB — CBC
HGB: 13.1 g/dL (ref 12.0–16.0)
MCH: 27.7 pg (ref 26.0–34.0)
MCHC: 34.4 g/dL (ref 32.0–36.0)
MCV: 81 fL (ref 80–100)
Platelet: 251 10*3/uL (ref 150–440)
RBC: 4.71 10*6/uL (ref 3.80–5.20)

## 2012-08-10 LAB — COMPREHENSIVE METABOLIC PANEL
Calcium, Total: 9.6 mg/dL (ref 8.5–10.1)
Co2: 26 mmol/L (ref 21–32)
Creatinine: 0.76 mg/dL (ref 0.60–1.30)
EGFR (African American): >60
Osmolality: 280 (ref 275–301)
Potassium: 3.8 mmol/L (ref 3.5–5.1)
SGPT (ALT): 40 U/L (ref 12–78)
Total Protein: 7.7 g/dL (ref 6.4–8.2)

## 2012-09-25 ENCOUNTER — Ambulatory Visit (INDEPENDENT_AMBULATORY_CARE_PROVIDER_SITE_OTHER): Payer: BC Managed Care – PPO | Admitting: Obstetrics and Gynecology

## 2012-09-25 ENCOUNTER — Encounter: Payer: Self-pay | Admitting: Obstetrics and Gynecology

## 2012-09-25 VITALS — BP 116/88 | HR 89 | Ht 61.0 in | Wt 227.0 lb

## 2012-09-25 DIAGNOSIS — R1032 Left lower quadrant pain: Secondary | ICD-10-CM

## 2012-09-25 MED ORDER — OXYCODONE-ACETAMINOPHEN 5-325 MG PO TABS: 1.0000 | ORAL_TABLET | ORAL | Status: DC | PRN

## 2012-09-25 NOTE — Progress Notes (Signed)
Pt has had left pelvic pain x 2-3 weeks

## 2012-09-25 NOTE — Progress Notes (Signed)
  Subjective:    Patient ID: Karen Bolton, female    DOB: [birth year], 32 y.o.   MRN: [id number]  HPI 32 yo G2P0020 with LMp 5/27 presenting today for evaluation of LLQ pain which have been present for the past 2 weeks. Patient describes the pain as throbbing pain that is not radiating and worst with movement. Pain is slightly alleviated by ibuprofen. Patient reports a similar episode in April, at that time she was diagnosed with an ovarian cyst. Patient did not follow-up as the pain resolved.  Patient also has been trying to conceive since January without success. She miscarried twice and her boyfriend has twins from a previous relationship. After conceiving, he was involved in a shooting and only has one testicle remaining. He is a smoker as well. Advised that her boyfriend be evaluated by his PCP or urologist for sperm count before we initiate an infertility work-up.  Past Medical History  Diagnosis Date  . Insomnia   . History of miscarriage     x2  . Anxiety   . Depression    Past Surgical History  Procedure Laterality Date  . Oophorectomy  2010    right ovary removed  . Cholecystectomy    . Wisdom tooth extraction     Family History  Problem Relation Age of Onset  . Cancer Mother 57    Breast Cancer  . Cancer Maternal Aunt 35    ovarian Cancer  . Cancer Maternal Uncle     pancreatic cancer  . Heart disease Father   . Diabetes Father   . Sleep apnea Father   . Cancer Paternal Grandmother     breast cancer  . Cancer Cousin     breast cancer   History  Substance Use Topics  . Smoking status: Never Smoker   . Smokeless tobacco: Never Used  . Alcohol Use: Yes     Comment: rare.      Review of Systems     Objective:   Physical Exam  GENERAL: Well-developed, well-nourished female in no acute distress.  ABDOMEN: Soft, tenderness in LLQ, no rebound, no guarding, nondistended. Obese. PELVIC: Normal external female genitalia. Vagina is pink and rugated.  Normal  discharge. Normal appearing cervix. Bimanual exam limited secondary to body habitus. No palpable adnexal mass, positive left adnexa, tenderness EXTREMITIES: No cyanosis, clubbing, or edema, 2+ distal pulses.       Assessment & Plan:  32 yo G2P0020 with LLQ pain  - pelvic ultrasound ordered to rule out ovarian cyst - Percocet prescribed - patient will be contacted with any abnormal results - RTC prn

## 2012-10-02 ENCOUNTER — Ambulatory Visit (HOSPITAL_COMMUNITY): Payer: BC Managed Care – PPO

## 2012-10-05 ENCOUNTER — Ambulatory Visit (HOSPITAL_COMMUNITY): Admission: RE | Admit: 2012-10-05 | Payer: BC Managed Care – PPO | Source: Ambulatory Visit

## 2012-11-08 ENCOUNTER — Emergency Department: Payer: Self-pay | Admitting: Emergency Medicine

## 2012-11-08 LAB — URINALYSIS, COMPLETE
Glucose,UR: NEGATIVE mg/dL (ref 0–75)
Ketone: NEGATIVE
Leukocyte Esterase: NEGATIVE
Nitrite: NEGATIVE
Protein: NEGATIVE
RBC,UR: 18 /HPF (ref 0–5)
Squamous Epithelial: 9
WBC UR: 2 /HPF (ref 0–5)

## 2013-02-15 ENCOUNTER — Emergency Department: Payer: Self-pay | Admitting: Emergency Medicine

## 2013-03-29 IMAGING — CR DG ELBOW 2V*R*
5 series · 5 of 5 positions shown · non-contrast
Comparison: 01/17/2013 and 7032 hours

CLINICAL DATA: Postreduction right elbow.  Limited range of motion.

RIGHT ELBOW - 2 VIEW

[x elbow ap right (1 of 3)]
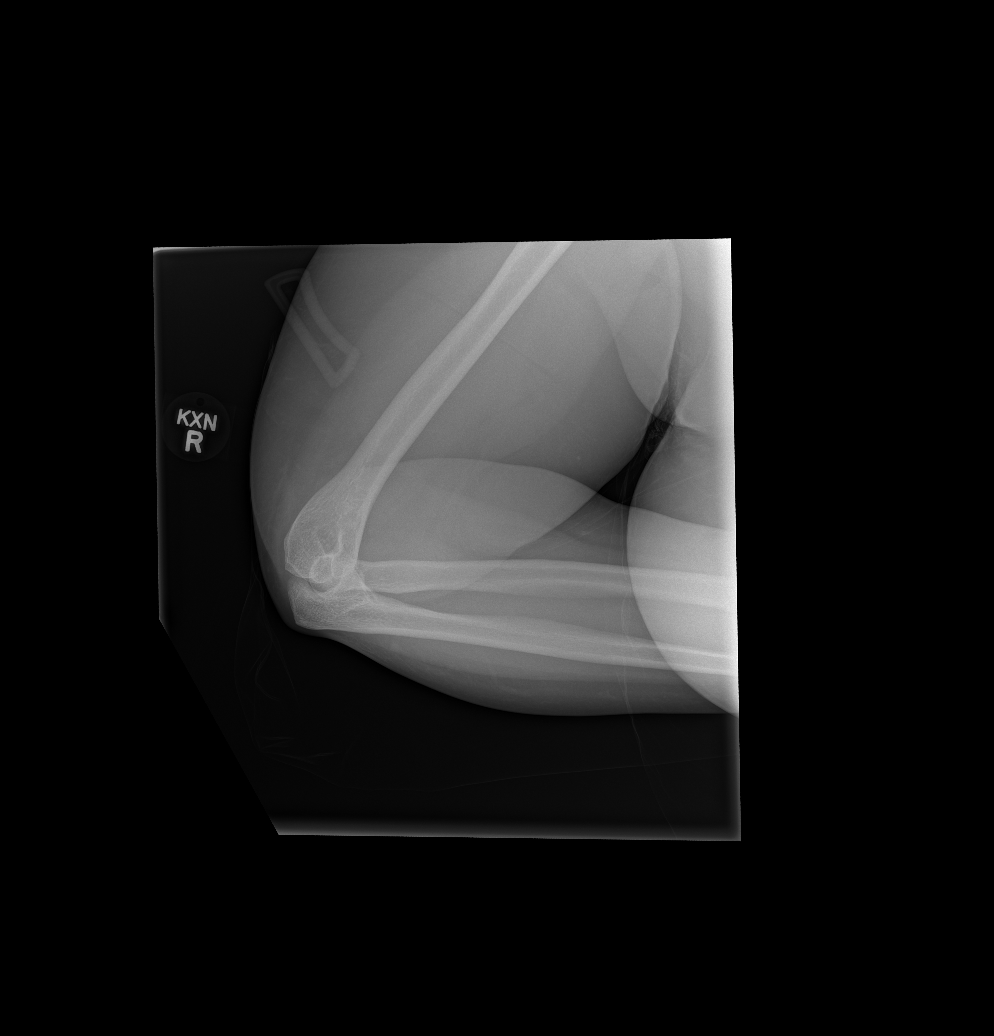

[x elbow ap right (2 of 3)]
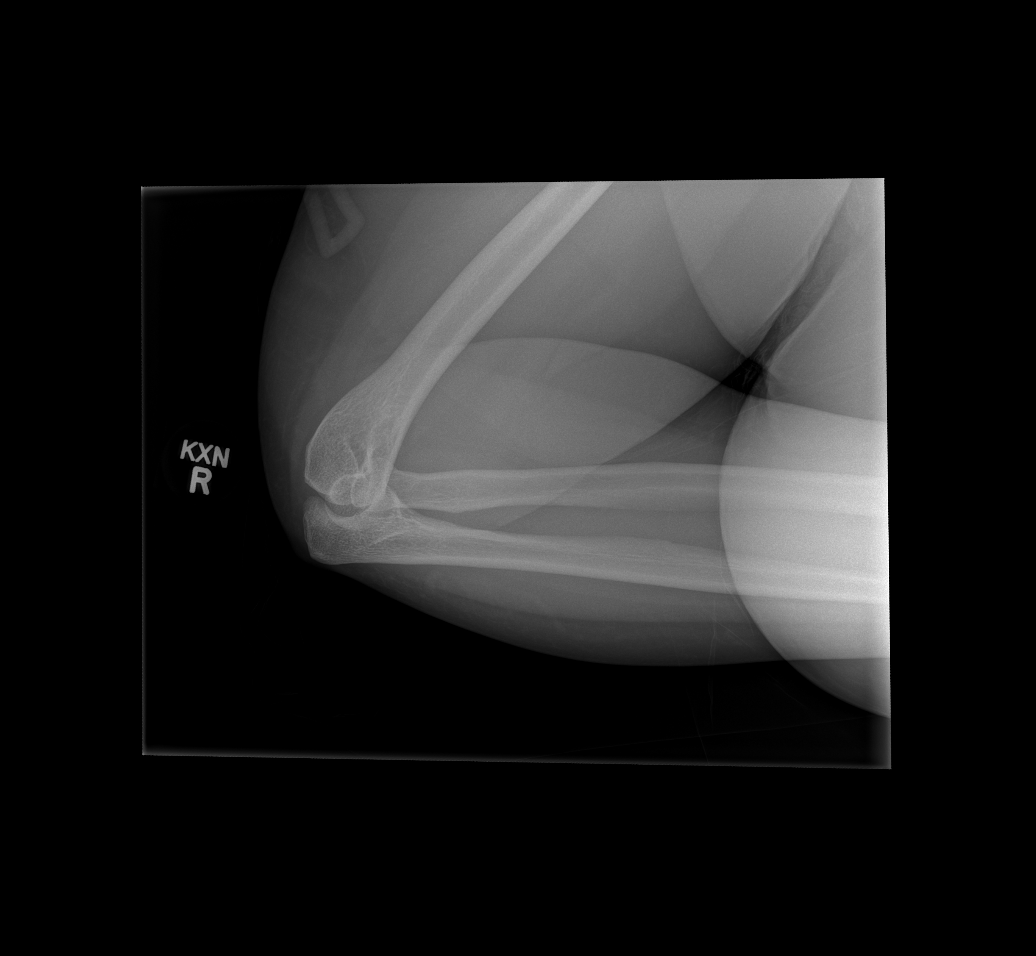

[x elbow lat right]
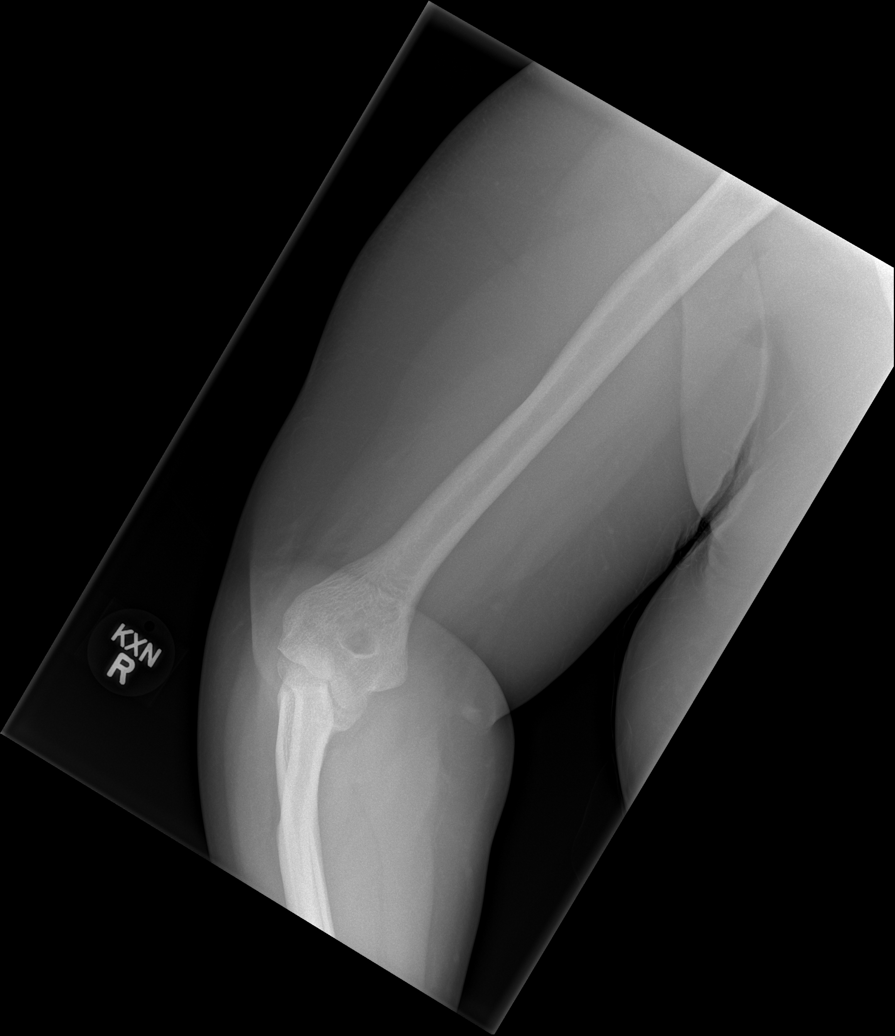

[x elbow ap right (3 of 3)]
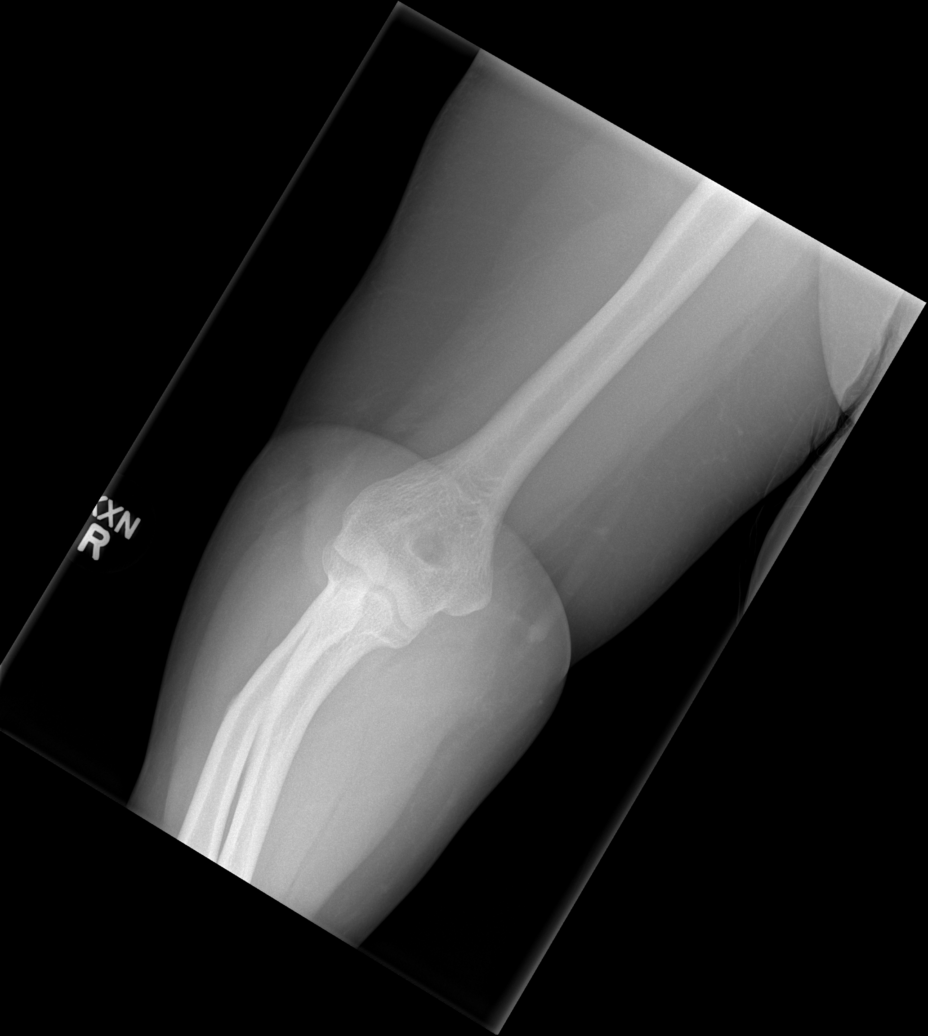

[x elbow obl right]
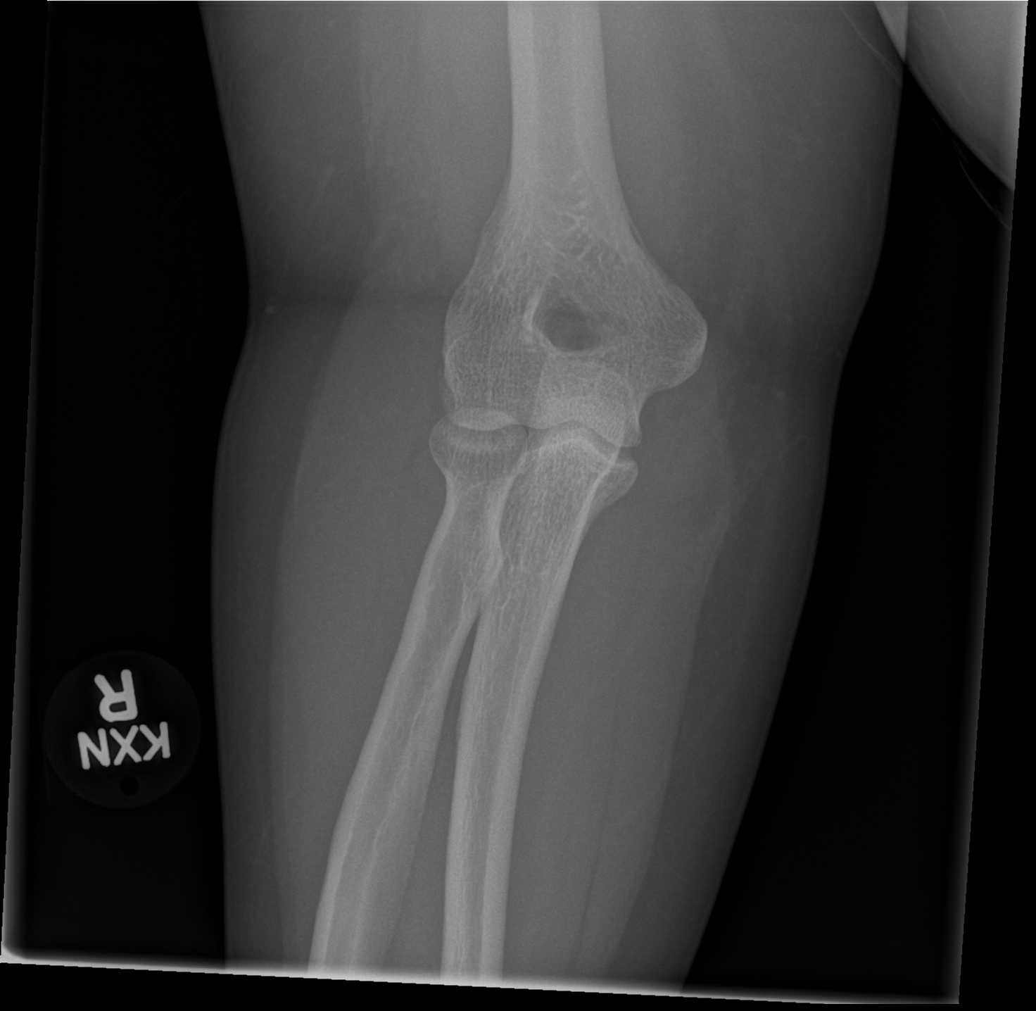

[5 of 5 positions shown; findings below may reference images not displayed]

FINDINGS: Nonstandard views are obtained, limiting technique.
There appears to be improved position of the elbow joint
postreduction.  No definite residual dislocation is noted.
However, nonstandard lateral view may obscure visualization of
residual subluxation.  No discrete fractures identified.
IMPRESSION: Improved position of the elbow joint postreduction although
nonstandard view limits evaluation.

## 2013-05-25 ENCOUNTER — Encounter: Payer: Self-pay | Admitting: Obstetrics & Gynecology

## 2013-05-25 ENCOUNTER — Ambulatory Visit (INDEPENDENT_AMBULATORY_CARE_PROVIDER_SITE_OTHER): Payer: BC Managed Care – PPO | Admitting: Obstetrics & Gynecology

## 2013-05-25 VITALS — BP 107/78 | HR 90 | Ht 61.0 in | Wt 247.0 lb

## 2013-05-25 DIAGNOSIS — Z0000 Encounter for general adult medical examination without abnormal findings: Secondary | ICD-10-CM

## 2013-05-25 DIAGNOSIS — Z23 Encounter for immunization: Secondary | ICD-10-CM

## 2013-05-25 DIAGNOSIS — Z1151 Encounter for screening for human papillomavirus (HPV): Secondary | ICD-10-CM

## 2013-05-25 DIAGNOSIS — G8929 Other chronic pain: Secondary | ICD-10-CM

## 2013-05-25 DIAGNOSIS — R102 Pelvic and perineal pain: Secondary | ICD-10-CM

## 2013-05-25 DIAGNOSIS — Z124 Encounter for screening for malignant neoplasm of cervix: Secondary | ICD-10-CM

## 2013-05-25 DIAGNOSIS — R1032 Left lower quadrant pain: Secondary | ICD-10-CM

## 2013-05-25 DIAGNOSIS — N949 Unspecified condition associated with female genital organs and menstrual cycle: Secondary | ICD-10-CM

## 2013-05-25 DIAGNOSIS — Z113 Encounter for screening for infections with a predominantly sexual mode of transmission: Secondary | ICD-10-CM

## 2013-05-25 NOTE — Progress Notes (Signed)
   Subjective:    Patient ID: Karen Lewandowskyamela Bolton, female    DOB: October 06, 1980, 33 y.o.   MRN: [id number]  HPI  33 yo MW lady here with the complaint of LLQ pain worse for the last week, present off and on for almost a year. She denies dyspareunia. She had her right ovary removed in [place] for "severe ovarian pain". She is taking IBU 800 prn   Review of Systems     Objective:   Physical Exam Normal speculum exam, normal bimanual. No masses appreciated or tenderness elicited.       Assessment & Plan:  LLQ - schedule u/s Preventative- pap and flu vaccine

## 2013-05-25 NOTE — Patient Instructions (Signed)

## 2013-05-25 NOTE — Progress Notes (Signed)
Patient has been having Left lower quadrant pain off and on for a couple of months.  It went away for a while and then the last couple of days it started back up again.

## 2013-05-31 ENCOUNTER — Ambulatory Visit (HOSPITAL_COMMUNITY): Payer: BC Managed Care – PPO | Attending: Obstetrics & Gynecology

## 2013-06-20 ENCOUNTER — Ambulatory Visit (HOSPITAL_COMMUNITY): Payer: BC Managed Care – PPO

## 2013-08-08 ENCOUNTER — Emergency Department: Payer: Self-pay | Admitting: Emergency Medicine

## 2013-08-09 LAB — URINALYSIS, COMPLETE
Bacteria: NONE SEEN
Bilirubin,UR: NEGATIVE
GLUCOSE, UR: NEGATIVE mg/dL (ref 0–75)
KETONE: NEGATIVE
LEUKOCYTE ESTERASE: NEGATIVE
Nitrite: NEGATIVE
PROTEIN: NEGATIVE
Ph: 5 (ref 4.5–8.0)
Specific Gravity: 1.02 (ref 1.003–1.030)

## 2013-08-09 LAB — COMPREHENSIVE METABOLIC PANEL
ALBUMIN: 3.5 g/dL (ref 3.4–5.0)
ALK PHOS: 78 U/L
ALT: 46 U/L (ref 12–78)
AST: 27 U/L (ref 15–37)
Anion Gap: 6 — ABNORMAL LOW (ref 7–16)
BUN: 12 mg/dL (ref 7–18)
Bilirubin,Total: 0.5 mg/dL (ref 0.2–1.0)
CHLORIDE: 104 mmol/L (ref 98–107)
Calcium, Total: 9.4 mg/dL (ref 8.5–10.1)
Co2: 27 mmol/L (ref 21–32)
Creatinine: 0.77 mg/dL (ref 0.60–1.30)
Glucose: 95 mg/dL (ref 65–99)
OSMOLALITY: 273 (ref 275–301)
POTASSIUM: 3.5 mmol/L (ref 3.5–5.1)
Sodium: 137 mmol/L (ref 136–145)
Total Protein: 7.8 g/dL (ref 6.4–8.2)

## 2013-08-09 LAB — CBC WITH DIFFERENTIAL/PLATELET
Basophil #: 0.1 10*3/uL (ref 0.0–0.1)
Basophil %: 0.8 %
EOS ABS: 0.1 10*3/uL (ref 0.0–0.7)
EOS PCT: 1.6 %
HCT: 40.8 % (ref 35.0–47.0)
HGB: 13.3 g/dL (ref 12.0–16.0)
LYMPHS ABS: 2.5 10*3/uL (ref 1.0–3.6)
Lymphocyte %: 26.6 %
MCH: 26.9 pg (ref 26.0–34.0)
MCHC: 32.7 g/dL (ref 32.0–36.0)
MCV: 82 fL (ref 80–100)
Monocyte #: 0.7 x10 3/mm (ref 0.2–0.9)
Monocyte %: 7.9 %
NEUTROS PCT: 63.1 %
Neutrophil #: 5.9 10*3/uL (ref 1.4–6.5)
PLATELETS: 279 10*3/uL (ref 150–440)
RBC: 4.96 10*6/uL (ref 3.80–5.20)
RDW: 14.1 % (ref 11.5–14.5)
WBC: 9.3 10*3/uL (ref 3.6–11.0)

## 2013-08-09 LAB — LIPASE, BLOOD: Lipase: 118 U/L (ref 73–393)

## 2013-12-06 ENCOUNTER — Emergency Department: Payer: Self-pay | Admitting: Emergency Medicine

## 2013-12-19 ENCOUNTER — Emergency Department: Payer: Self-pay | Admitting: Emergency Medicine

## 2014-02-11 ENCOUNTER — Encounter: Payer: Self-pay | Admitting: Obstetrics & Gynecology

## 2014-05-20 ENCOUNTER — Observation Stay: Payer: Self-pay | Admitting: Surgery

## 2014-05-20 LAB — COMPREHENSIVE METABOLIC PANEL
ALK PHOS: 89 U/L (ref 46–116)
ALT: 95 U/L — AB (ref 14–63)
Albumin: 3.6 g/dL (ref 3.4–5.0)
Anion Gap: 10 (ref 7–16)
BILIRUBIN TOTAL: 0.3 mg/dL (ref 0.2–1.0)
BUN: 11 mg/dL (ref 7–18)
CHLORIDE: 106 mmol/L (ref 98–107)
CREATININE: 0.87 mg/dL (ref 0.60–1.30)
Calcium, Total: 9.5 mg/dL (ref 8.5–10.1)
Co2: 24 mmol/L (ref 21–32)
EGFR (African American): >60
EGFR (Non-African Amer.): >60
Glucose: 99 mg/dL (ref 65–99)
Osmolality: 279 (ref 275–301)
Potassium: 3.7 mmol/L (ref 3.5–5.1)
SGOT(AST): 75 U/L — ABNORMAL HIGH (ref 15–37)
Sodium: 140 mmol/L (ref 136–145)
TOTAL PROTEIN: 7.7 g/dL (ref 6.4–8.2)

## 2014-05-20 LAB — URINALYSIS, COMPLETE
Bacteria: NONE SEEN
Bilirubin,UR: NEGATIVE
Glucose,UR: NEGATIVE mg/dL (ref 0–75)
KETONE: NEGATIVE
LEUKOCYTE ESTERASE: NEGATIVE
NITRITE: NEGATIVE
PH: 6 (ref 4.5–8.0)
PROTEIN: NEGATIVE
Specific Gravity: 1.005 (ref 1.003–1.030)
Squamous Epithelial: 1

## 2014-05-20 LAB — CBC WITH DIFFERENTIAL/PLATELET
BASOS ABS: 0.1 10*3/uL (ref 0.0–0.1)
Basophil %: 0.5 %
EOS ABS: 0.1 10*3/uL (ref 0.0–0.7)
Eosinophil %: 0.7 %
HCT: 38 % (ref 35.0–47.0)
HGB: 12.7 g/dL (ref 12.0–16.0)
LYMPHS PCT: 24.4 %
Lymphocyte #: 2.6 10*3/uL (ref 1.0–3.6)
MCH: 27.4 pg (ref 26.0–34.0)
MCHC: 33.3 g/dL (ref 32.0–36.0)
MCV: 82 fL (ref 80–100)
MONO ABS: 0.7 x10 3/mm (ref 0.2–0.9)
Monocyte %: 6.8 %
NEUTROS PCT: 67.6 %
Neutrophil #: 7.3 10*3/uL — ABNORMAL HIGH (ref 1.4–6.5)
PLATELETS: 292 10*3/uL (ref 150–440)
RBC: 4.64 10*6/uL (ref 3.80–5.20)
RDW: 14.9 % — AB (ref 11.5–14.5)
WBC: 10.7 10*3/uL (ref 3.6–11.0)

## 2014-05-27 ENCOUNTER — Ambulatory Visit: Payer: Self-pay | Admitting: Obstetrics and Gynecology

## 2014-06-01 ENCOUNTER — Inpatient Hospital Stay: Payer: Self-pay | Admitting: Surgery

## 2014-06-10 ENCOUNTER — Ambulatory Visit: Payer: Self-pay | Admitting: Obstetrics & Gynecology

## 2014-06-12 ENCOUNTER — Ambulatory Visit: Payer: Self-pay | Admitting: Obstetrics and Gynecology

## 2014-06-25 ENCOUNTER — Ambulatory Visit: Payer: Self-pay | Admitting: Obstetrics & Gynecology

## 2014-06-25 DIAGNOSIS — Z01419 Encounter for gynecological examination (general) (routine) without abnormal findings: Secondary | ICD-10-CM
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
## 2014-07-30 NOTE — Consult Note (Signed)
PATIENT NAME:  Karen Bolton, Karen Bolton MR#:  [id number] DATE OF BIRTH:  12-Dec-1980  DATE OF CONSULTATION:  03/20/2012  REFERRING PHYSICIAN:  Jene Every, MD  CONSULTING PHYSICIAN:  Jayquan Bradsher B. Sherlin Sonier, MD  REASON FOR CONSULTATION: To evaluate a suicidal patient.   IDENTIFYING DATA: Karen Bolton is a 34 year old female with history of bipolar disorder.   CHIEF COMPLAINT: "I'm fine now".   HISTORY OF PRESENT ILLNESS: Karen Bolton has been diagnosed with bipolar disorder in 2009. She has been in the care of a psychiatrist. She did see the psychiatrist last Thursday. At that time she was switched from Depakote to Tegretol and continued on Seroquel. When asked why she has been maintained on such low doses, 100 of Seroquel, 250 of Depakote, and 200 of carbamazepine, she tells me that she is exquisitely sensitive to side effects and, therefore, unable to tolerate any higher doses. She reports that on the night of admission she got very upset with her husband. She wanted to buy a Christmas tree but the husband preferred to buy a 12 pack of beer. She felt that no longer they talk or communicate at all, that he has been neglecting her and has not been paying her any attention. She became upset and suicidal. She called Crisis Line at [facility], her psychiatric provider. She was advised to come to the hospital. In the hospital, she was trying to explain that she was not suicidal but was kept here overnight. She denies current active suicidal ideation or a plan. She does admit that she has been chronically suicidal since 2009 when she was hospitalized for overdose on medications. She also explains and that she got so sick with the first overdose that she promised herself and her mother that she would never try it again. She felt that she did the right thing by calling the crisis number and in her opinion the situation escalated out of proportion. However, she was rather agitated at the beginning of her stay in the Emergency  Room. She now decided that she no longer wants to be married. Her marriage lasted only six months. She wants to go home and separate from her husband. She has a friend and family members to help her with that. She denies psychotic symptoms. There are no symptoms suggestive of bipolar mania. She denies alcohol, illicit drugs, or prescription pills.   PAST PSYCHIATRIC HISTORY: In 2009 she overdosed on medication and was hospitalized. She has been under psychiatric care since recently going to [facility] where her medications were recently switched. She reports good compliance with medication.   FAMILY PSYCHIATRIC HISTORY: None reported.   PAST MEDICAL HISTORY: None.   ALLERGIES: Dilaudid, penicillin, tramadol, Zofran.   MEDICATIONS ON ADMISSION:  1. Tegretol XR 100 mg twice daily.  2. Klonopin 1 mg twice daily.  3. Seroquel 100 mg at bedtime.   SOCIAL HISTORY: This is her second marriage. She divorced her first husband. They no longer were romantically involved even though they were friends. She married her husband six months ago. They have known each other for five years. He has two children, ages 51, who are in custody of his ex-wife but the couple has them every other week. She quit school in the 10th grade to take care of her brother. Never got her GED. She is currently unemployed.   REVIEW OF SYSTEMS: CONSTITUTIONAL: No fevers or chills. No weight changes. EYES: No double or blurred vision. ENT: No hearing loss. RESPIRATORY: No shortness of breath or cough. CARDIOVASCULAR: No  chest pain or orthopnea. GASTROINTESTINAL: No abdominal pain, nausea, vomiting, or diarrhea. GU: No incontinence or frequency. ENDOCRINE: No heat or cold intolerance. LYMPHATIC: No anemia or easy bruising. INTEGUMENTARY: No acne or rash. MUSCULOSKELETAL: No muscle or joint pain. NEUROLOGIC: No tingling or weakness. PSYCHIATRIC: See history of present illness for details.   PHYSICAL EXAMINATION:   VITAL SIGNS: Blood pressure  179/77, pulse 102, respirations 20, temperature 96.   GENERAL: This is a well developed female in no acute distress.   The rest of the physical examination is deferred to her primary attending.   LABORATORY DATA: Chemistries are within normal limits. Blood alcohol level 0. LFTs within normal limits. TSH 5.76. Urine tox screen negative for substances. CBC within normal limits. Urinalysis is not suggestive of urinary tract infection. Serum acetaminophen less than 2. Pregnancy test, urine, negative.   MENTAL STATUS EXAMINATION: The patient is alert and oriented to person, place, time, and situation. She is pleasant, polite, and cooperative. She is cool and collected. She reportedly was agitated initially. She maintains good eye contact. She is well groomed and wearing hospital scrubs. Her speech is of normal rhythm, rate, and volume. Mood is fine with reactive affect. Thought processing is logical and goal oriented. Thought content she denies suicidal or homicidal ideation. There are no delusions or paranoia. There are no auditory or visual hallucinations. Her cognition is grossly intact. Her insight and judgment are fair.   SUICIDE RISK ASSESSMENT: This is a patient with history of mood instability and one suicide attempt who is in the process of switching her medications but seems to have a good report with her psychiatrist and is treatment compliant. She is forward thinking and optimistic about the future.   DIAGNOSES:  AXIS I: Mood disorder, not otherwise specified.   AXIS II: Deferred.   AXIS III: None.   AXIS IV: Mental illness, marital conflict, financial.   AXIS V: GAF 45.   PLAN:  1. The patient no longer meets criteria for involuntary inpatient psychiatric commitment. I will terminate proceedings. Please discharge as appropriate.  2. She is to continue Tegretol and Seroquel as prescribed by her primary psychiatrist. No prescriptions necessary.  3. [facility]   4. She needs a taxi to get home.   ____________________________ Ellin GoodieJolanta B. Jennet MaduroPucilowska, MD jbp:drc D: 03/20/2012 18:44:20 ET T: 03/21/2012 10:09:01 ET JOB#: [id number]  cc: Cormick Moss B. Jennet MaduroPucilowska, MD, <Dictator> Shari ProwsJOLANTA B Khaleef Ruby MD ELECTRONICALLY SIGNED 03/25/2012 2:17

## 2014-08-05 LAB — SURGICAL PATHOLOGY

## 2014-08-11 NOTE — H&P (Signed)
PATIENT NAME:  Karen Bolton, Karen Bolton MR#:  [id number] DATE OF BIRTH:  1980/05/12  DATE OF ADMISSION:  05/20/2014  PRIMARY CARE PHYSICIAN: [facility]   ADMITTING PHYSICIAN: Quentin Ore III, MD   CHIEF COMPLAINT: Right lower quadrant abdominal pain.   BRIEF HISTORY: Karen Bolton is a 34 year old woman seen in the emergency room with a 5-day history of right lower quadrant abdominal pain. She had the sudden onset of abdominal pain on Wednesday, which persisted through the last 5 days. The pain has been constant, not colicky, not associated with activity or eating. She has been anorexic but able to eat. She relates she has been mildly nauseated but has not vomited. She denies any fever or chills. She has had normal bowel function. Because of the persistent pain, she presented to the emergency room for further evaluation.   She has right lower quadrant pain every month during her menstrual cycle despite the fact that she has had a previous right oophorectomy for cystic disease. She claims the scar tissue on the right side of her abdomen gives her symptoms every month during her menstruation. However, she knew this problem was different as she is not due to have her menstrual cycle begin for 2 more weeks. She denies any other significant abdominal history. Specifically, she denies history of hepatitis, yellow jaundice, pancreatitis, peptic ulcer disease or diverticulitis. She had her gallbladder removed in another Hospital 6 years ago. She has not had an endoscopy in the past. She denies any other major medical problems. She has no history of heart disease, hypertension, diabetes or thyroid problems.   CURRENT MEDICATIONS: Include Seroquel 100 mg once a day and Xanax 0.5 mg b.i.d. p.r.n.   ALLERGIES: SHE IS ALLERGIC TO DILAUDID, PENICILLIN, TRAMADOL AND ZOFRAN ALL OF WHICH CAUSE HIVES AND RASH.   SOCIAL HISTORY: She is not a cigarette smoker, does not drink alcohol and does not work outside her home.  She has not had any children of her own but is a stepmother to two 82-year-old twins. She does have psychiatric history being admitted in the past in 2014 for suicidal ideation.   REVIEW OF SYSTEMS: Otherwise unremarkable. Specifically, she has no chest pain, shortness of breath or urinary symptoms. She denies any current psych issues. A 10 point review of systems was carried out and is otherwise unremarkable.   FAMILY HISTORY: Negative with regard to the current problem but it is positive for diabetes and hypertension.   PHYSICAL EXAMINATION:  GENERAL: She is lying comfortably in bed on the phone with no particular complaints currently. She does not have any significant pain.  She has been treated with pain medication.  VITAL SIGNS:  Blood pressure is 120/80. Heart rate is 90 and regular. Oxygen saturation is 98% and she is afebrile.  HEENT: No scleral icterus. No pupillary abnormalities. Normal ears and normal oral cavity.   LYMPHATIC: No adenopathy in her neck, axillae or groin.  PULMONARY: No adventitious sounds and she has normal pulmonary excursion.  CARDIAC: No murmurs or gallops. She seems to be in normal sinus rhythm.  ABDOMEN: Soft with mild right lower quadrant tenderness. Minimal guarding. No rebound. No masses. Active bowel sounds.  MUSCULOSKELETAL:  Full range of motion and no deformities. Good distal pulses.  NEUROLOGIC: Uniformity throughout without any obvious facial abnormalities.  PSYCHIATRIC: Normal orientation, normal affect.  SKIN:  No abrasions, lesions or lacerations.   LABORATORY DATA: On workup in the emergency room she was noted to have a normal white  blood cell count and normal laboratory values. CT scan was performed which demonstrated a small short appendix right lower quadrant at 9 mm at its largest diameter. There is a question of periappendiceal fluid. No other abnormalities were identified.   ASSESSMENT AND PLAN: I independently reviewed her CT scan. Clinically,  with her history and physical exam today I do not believe she has acute appendicitis. She has had a recent upper respiratory infection, which she has been treating chronically with over-the-counter medications. This set of clinical circumstances could represent acute mesenteric adenitis. Her CT scan has minimal changes in her appendix. I think what we will do at this point is admit her to the hospital observation, place her on IV antibiotics. We will treat her upper respiratory symptoms and perform serial examinations. This plan has been discussed with the patient in detail and she is in agreement.   TOTAL TIME SPENT: 45 minutes.    ____________________________ Quentin Orealph L. Ely III, MD rle:AT D: 05/20/2014 06:30:32 ET T: 05/20/2014 06:56:07 ET JOB#: [id number]  cc: Quentin Orealph L. Ely III, MD, <Dictator> Quentin OreALPH L ELY MD ELECTRONICALLY SIGNED 05/27/2014 17:42

## 2014-08-11 NOTE — Op Note (Signed)
PATIENT NAME:  Karen LewandowskySAUNDERS, Tyliah MR#:  [id number] DATE OF BIRTH:  11/08/1980  DATE OF PROCEDURE:  05/20/2014  PREOPERATIVE DIAGNOSIS:  Acute appendicitis.   POSTOPERATIVE DIAGNOSIS:  Acute appendicitis.   PROCEDURE:  Laparoscopic appendectomy.   SURGEON:  Richard E. Excell Seltzerooper, MD  ANESTHESIA:  General with endotracheal tube.   INDICATIONS:  This is a patient with progressive right lower quadrant/right flank pain with a workup with normal white blood cell count, but CT findings suggesting retrocecal appendix with appendicitis. Her physical exam was suggestive of appendicitis as well.   Preoperatively, we discussed the rationale for surgery, the options of observation, risks of bleeding, infection, recurrence, failure to resolve her symptoms, and conversion to an open procedure. This was all reviewed for her and her family in the preoperative holding area and discussed with Dr. Egbert GaribaldiBird, who had done her original workup. She understood and agreed to proceed.   FINDINGS:  Acute appendicitis, not in a retrocecal position but somewhat lateral.   DESCRIPTION OF PROCEDURE:  The patient was induced to general anesthesia. She was given IV antibiotics. VTE prophylaxis was in place. She was prepped and draped in a sterile fashion. Marcaine was infiltrated in the skin and subcutaneous tissues around the periumbilical area. Incision was made, and an extra-long Veress needle was utilized due to her massive obesity. Then, pneumoperitoneum was obtained without difficulty. A 5 mm extra-long trocar was utilized to enter the abdominal cavity. The abdominal cavity was explored. There were adhesions in the infraumbilical suprapubic area, and under direct vision, a 5 mm suprapubic port was placed somewhat to the right of midline and a left lateral port was placed 13 mm as well under direct vision.   With the camera in the left lateral port, adhesions were taken down sharply without the use of energy from the midline. These  adhesions were all omental. The previously noted small-bowel adhesion considerably caudad to the umbilical and suprapubic port site was not taken down and was left in place.   The camera was then placed in the periumbilical site, and attention was turned to the right lower quadrant, where the appendix was identified. It was not retrocecal, but was in the right lateral position. It was elevated. The base of the appendix was divided with an Endo GIA standard load, and then the mesoappendix was divided with a vascular load Endo GIA. The specimen was passed out through the lateral port site with the aid of an Endo Catch bag. The area was checked for hemostasis, irrigated with copious amounts of normal saline, and there was no sign of bleeding either from the right lower quadrant appendiceal stump area or mesoappendix nor was there any active bleeding from the omental adhesions that had been taken down bluntly.   With no sign of bleeding and no sign of bowel injury, the left lateral port was closed under direct vision with multiple simple sutures of 0 Vicryl utilizing an Endo Close technique under direct vision. Again, hemostasis was found to be adequate. Pneumoperitoneum was released. All ports were removed. Then, 4-0 subcuticular Monocryl was used on all skin edges. Steri-Strips, Mastisol, and sterile dressings were placed.   The patient tolerated the procedure well. There were no complications. She was taken to the recovery room in stable condition to be admitted for continued care.    ____________________________ Adah Salvageichard E. Excell Seltzerooper, MD rec:nb D: 05/20/2014 21:39:45 ET T: 05/21/2014 02:53:02 ET JOB#: [id number]  cc: Adah Salvageichard E. Excell Seltzerooper, MD, <Dictator> Lattie HawICHARD E COOPER MD ELECTRONICALLY SIGNED 05/21/2014  7:08 

## 2014-08-11 NOTE — H&P (Signed)
PATIENT NAME:  Karen Bolton, Karen Bolton MR#:  [id number] DATE OF BIRTH:  02-18-1981  DATE OF ADMISSION:  06/01/2014  CHIEF COMPLAINT: Abdominal pain.   HISTORY OF PRESENT ILLNESS: This is a 34 year old female patient who underwent a laparoscopic appendectomy for nonruptured appendicitis on February 8. She was seen in the office this week, Tuesday, with abdominal pain, nausea, painful defecation, painful urination. No fevers or chills. Urinalysis had shown red blood cells in her urine and microscopic hematuria. She came to the Emergency Room because she has had ongoing abdominal pain that she rates a 10/10. She has vomited several times. Was able to keep liquids down until she was given magnesium citrate in the Emergency Room. She states that she has painful defecation, but also states that she has only had 2 bowel movements since surgery 11 days ago. She denies fevers or chills. Denies melena or hematochezia. No hematemesis.   She was seen in the Emergency Room, enemas and magnesium citrate were ordered in consultation with Dr. Fanny Bien in the ER. She had no results from either and, in fact, probably vomited the magnesium citrate. Therefore, I was asked to admit the patient for her likely constipation and abdominal pain.   PAST MEDICAL HISTORY: None.   PAST SURGICAL HISTORY: Laparoscopic appendectomy, oophorectomy, cholecystectomy.   MEDICATIONS: Multiple, see reconciliation.   ALLERGIES: DILAUDID, PENICILLIN, TRAMADOL, ZOFRAN, MORPHINE.   SOCIAL HISTORY: She does not smoke or drink. She is a Futures trader.   REVIEW OF SYSTEMS: A complete system review is performed and negative with the exception of that mentioned in the HPI.   PHYSICAL EXAMINATION:  GENERAL: Morbidly obese female patient, BMI of 39. She appears comfortable in the Emergency Room.  VITAL SIGNS: Temperature of 98.1, pulse of 82, respirations 18, blood pressure 106/54, pain scale has gone from 10 to 10 to 0 to 9 to 4.  HEENT: Shows no scleral  icterus.  NECK: No palpable neck nodes.  CHEST: Clear to auscultation.  CARDIAC: Regular rate and rhythm.  ABDOMEN: Soft, nondistended, non-tympanitic. Wounds are dressed. There is no guarding, no rebound. No percussion tenderness. Only minimal tenderness to deep palpation throughout the periumbilical area only.  EXTREMITIES: Without edema.  NEUROLOGIC: Grossly intact.  INTEGUMENT: Shows no jaundice.   DIAGNOSTIC DATA: KUB is personally reviewed showing possible ileus, but there is an large amount of stool in the right colon. Electrolytes are within normal limits. AST and ALT are mildly elevated at 56 and 84. White blood cell count is 7.9, hemoglobin and hematocrit of 12 and 35 with a platelet count of 324,000. Urinalysis shows 3+ blood with 11 red cells per high-power field, trace bacteria, negative leukocyte esterase.   ASSESSMENT AND PLAN: This is a patient with abdominal pain after a laparoscopic appendectomy; she rates it a 10/10. I had seen her in the office earlier in the week. Her complaints and her exam are essentially the same as they were for me in the office on Tuesday. The only abnormality we could find on her labs was that of hematuria, microscopic, on Tuesday and then repeated again today. She has a normal white blood cell count. She has a large amount of stool in her right colon. I believe the most of her symptoms are related to constipation or obstipation. She has failed to pass any stool with the enemas and magnesium citrate. My recommendations in consult with Dr. Fanny Bien were to admit the patient to the hospital. Consider nasogastric tube if she vomits, but at this point, I believe  that we could manage her without that. She has no obvious signs of a bowel obstruction, as she is passing gas and having stool, but only rarely. I would consider a CT scan in the morning as well and repeating enemas. This was discussed with Dr. Fanny BienQuale.     ____________________________ Karen Salvageichard E. Excell Seltzerooper,  MD rec:bm D: 06/01/2014 00:48:00 ET T: 06/01/2014 02:00:49 ET JOB#: [id number]  cc: Karen Salvageichard E. Excell Seltzerooper, MD, <Dictator> Lattie HawICHARD E COOPER MD ELECTRONICALLY SIGNED 06/01/2014 7:04

## 2014-08-11 NOTE — Discharge Summary (Signed)
PATIENT NAME:  Karen LewandowskySAUNDERS, Niveah MR#:  [id number] DATE OF BIRTH:  1980/07/28  DATE OF ADMISSION:  06/01/2014 DATE OF DISCHARGE:  06/05/2014  DIAGNOSES: Abdominal pain, pelvic fluid collection.   PROCEDURES: None.   HISTORY OF PRESENT ILLNESS AND HOSPITAL COURSE: This is a patient who is approximately 12 days status post laparoscopic appendectomy. The appendix was not ruptured but she presented first to the office and then 2 days later to the Emergency Room complaining of diffuse abdominal pain with nausea and occasional emesis. She was also having painful urination, painful defecation, but denied fevers or chills. She had only had 2 bowel movements from the time of surgery until being seen in the Emergency Room. I saw her personally in the Emergency Room where she was admitted to the hospital for some diffuse mild tenderness. Continued workup including a CT scan suggested fluid collection without obvious abscess. Her white blood cell counts remained normal serially and she never had a fever. It was ultimately decided to start her on some IV antibiotics, which seemed to improve her pain. She was switched to oral antibiotics and was discharged in stable condition on oral antibiotics and analgesics, to follow up in our office in 10 days, tolerating a diet.   ____________________________ Adah Salvageichard E. Excell Seltzerooper, MD rec:TM D: 06/10/2014 18:10:06 ET T: 06/10/2014 23:12:30 ET JOB#: [id number]  cc: Adah Salvageichard E. Excell Seltzerooper, MD, <Dictator> Lattie HawICHARD E COOPER MD ELECTRONICALLY SIGNED 06/11/2014 7:41

## 2014-09-11 ENCOUNTER — Encounter: Payer: Self-pay | Admitting: Nurse Practitioner

## 2014-09-11 ENCOUNTER — Ambulatory Visit (INDEPENDENT_AMBULATORY_CARE_PROVIDER_SITE_OTHER): Payer: BLUE CROSS/BLUE SHIELD | Admitting: Nurse Practitioner

## 2014-09-11 ENCOUNTER — Encounter (INDEPENDENT_AMBULATORY_CARE_PROVIDER_SITE_OTHER): Payer: Self-pay

## 2014-09-11 VITALS — BP 110/70 | HR 93 | Temp 98.0°F | Resp 12 | Ht 61.0 in | Wt 246.8 lb

## 2014-09-11 DIAGNOSIS — Z7689 Persons encountering health services in other specified circumstances: Secondary | ICD-10-CM

## 2014-09-11 DIAGNOSIS — N3946 Mixed incontinence: Secondary | ICD-10-CM

## 2014-09-11 DIAGNOSIS — M545 Low back pain: Secondary | ICD-10-CM

## 2014-09-11 DIAGNOSIS — G8929 Other chronic pain: Secondary | ICD-10-CM

## 2014-09-11 DIAGNOSIS — Z7189 Other specified counseling: Secondary | ICD-10-CM | POA: Diagnosis not present

## 2014-09-11 DIAGNOSIS — M25511 Pain in right shoulder: Secondary | ICD-10-CM

## 2014-09-11 DIAGNOSIS — G4700 Insomnia, unspecified: Secondary | ICD-10-CM

## 2014-09-11 MED ORDER — PHENTERMINE HCL 37.5 MG PO CAPS: 37.5000 mg | ORAL_CAPSULE | ORAL | Status: DC

## 2014-09-11 MED ORDER — CYCLOBENZAPRINE HCL 10 MG PO TABS
10.0000 mg | ORAL_TABLET | Freq: Every day | ORAL | Status: DC
Start: 2014-09-11 — End: 2015-03-27

## 2014-09-11 NOTE — Patient Instructions (Addendum)
We will contact you about your referral to Neurosurgery (may be a week or two)  Try the muscle relaxer at night - will make you drowsy.   Try phentermine and we will follow up in 1 month to see how it will go.

## 2014-09-11 NOTE — Progress Notes (Signed)
Subjective:    Patient ID: Karen Bolton, female    DOB: Feb 28, 1981, 34 y.o.   MRN: [id number]  HPI   [name] is a 34 yo female establishing care today and shoulder pain x 2 weeks (both).   1) New pt info:   Immunizations- UTD  Mammogram- Baseline   Pap- OB/GYN sees next week   Eye Exam- Due   Dental Exam- Due  2) Chronic Problems-  Insomnia- Takes xanax and Seroquel ([facility] in [place])   3) Acute Problems-   R>L shoulder pain x 2 weeks, no trauma, worse at night points to trapezius muscles, treatment to date: Ibuprofen, tylenol, Advil   Sneeze, cough, full bladder, leaks, does not lose everything   Cough- stress/urge incontinence- sees bathroom and needs to go, patient has had 0 children   Review of Systems  Constitutional: Positive for fatigue. Negative for fever, chills and diaphoresis.  HENT: Negative for tinnitus and trouble swallowing.   Eyes: Negative for visual disturbance.  Respiratory: Negative for chest tightness, shortness of breath and wheezing.   Cardiovascular: Negative for chest pain, palpitations and leg swelling.  Gastrointestinal: Negative for nausea, vomiting, diarrhea and constipation.  Genitourinary: Positive for urgency. Negative for dysuria, frequency, flank pain and difficulty urinating.  Musculoskeletal: Positive for back pain and arthralgias. Negative for neck pain.       Shoulders painful  Skin: Negative for rash.  Neurological: Negative for dizziness, weakness, numbness and headaches.  Hematological: Does not bruise/bleed easily.  Psychiatric/Behavioral: Positive for sleep disturbance. Negative for suicidal ideas. The patient is not nervous/anxious.    Past Medical History  Diagnosis Date  . Insomnia   . History of miscarriage     x2  . Anxiety   . Depression   . Morbid obesity     History   Social History  . Marital Status: Married    Spouse Name: N/A  . Number of Children: N/A  . Years of Education: N/A    Occupational History  . Not on file.   Social History Main Topics  . Smoking status: Never Smoker   . Smokeless tobacco: Never Used  . Alcohol Use: 0.0 oz/week    0 Standard drinks or equivalent per week     Comment: rare  . Drug Use: No  . Sexual Activity:    Partners: Male    Birth Control/ Protection: None   Other Topics Concern  . Not on file   Social History Narrative   Homemaker    Lives with husband, 2 stepchildren, and mother-in-law    Pets- 1 dog inside   Caffeine- No coffee, 2 20 oz bottles soda, no tea, rare chocolate    Enjoys taking kids to the park         Past Surgical History  Procedure Laterality Date  . Oophorectomy  2010    right ovary removed  . Cholecystectomy    . Wisdom tooth extraction N/A 2001  . Abdominal hysterectomy      Right ovary removed  . Appendectomy      2016    Family History  Problem Relation Age of Onset  . Cancer Mother 77    Breast Cancer  . Cancer Maternal Aunt 35    ovarian Cancer  . Cancer Maternal Uncle     pancreatic cancer  . Heart disease Father   . Diabetes Father   . Sleep apnea Father   . Alcohol abuse Father   . Cancer Paternal Grandmother  breast cancer  . Cancer Cousin     breast cancer    Allergies  Allergen Reactions  . Penicillins Swelling  . Dilaudid [Hydromorphone Hcl] Rash    No current outpatient prescriptions on file prior to visit.   No current facility-administered medications on file prior to visit.      Objective:   Physical Exam  Constitutional: She is oriented to person, place, and time. She appears well-developed and well-nourished. No distress.  BP 110/70 mmHg  Pulse 93  Temp(Src) 98 F (36.7 C) (Oral)  Resp 12  Ht '5\' 1"'  (1.549 m)  Wt 246 lb 12.8 oz (111.948 kg)  BMI 46.66 kg/m2  SpO2 97%  LMP 08/15/2014   HENT:  Head: Normocephalic and atraumatic.  Right Ear: External ear normal.  Left Ear: External ear normal.  Patient constantly (met her with her  husband's new pt appnt) sounds like she has a lot of tissue in her airway or is congested (not a new problem when discussed)   Eyes: EOM are normal. Pupils are equal, round, and reactive to light. Right eye exhibits no discharge. Left eye exhibits no discharge. No scleral icterus.  Neck: Normal range of motion. Neck supple. No thyromegaly present.  Cardiovascular: Normal rate, regular rhythm and normal heart sounds.  Exam reveals no gallop and no friction rub.   No murmur heard. Pulmonary/Chest: Effort normal and breath sounds normal. No respiratory distress. She has no wheezes. She has no rales. She exhibits no tenderness.  Abdominal: Soft. Bowel sounds are normal. She exhibits no distension and no mass. There is no tenderness. There is no rebound and no guarding.  Obese  Musculoskeletal:  Iliopsoas 5/5 Bilateral, Tib anterior 5/5 bilateral, EHL 5/5 bilateral, no ankle clonus, intact heel/toe/sequential walking, sensation intact upper and lower extremities. Straight leg raise negative bilaterally.   Lymphadenopathy:    She has no cervical adenopathy.  Neurological: She is alert and oriented to person, place, and time. No cranial nerve deficit. She exhibits normal muscle tone. Coordination normal.  Skin: Skin is warm and dry. No rash noted. She is not diaphoretic.  Psychiatric: She has a normal mood and affect. Her behavior is normal. Judgment and thought content normal.      Assessment & Plan:

## 2014-09-11 NOTE — Progress Notes (Signed)
Pre visit review using our clinic review tool, if applicable. No additional management support is needed unless otherwise documented below in the visit note. 

## 2014-09-19 MED ORDER — AZITHROMYCIN 250 MG PO TABS: ORAL_TABLET | ORAL | Status: DC

## 2014-09-28 DIAGNOSIS — M545 Low back pain: Principal | ICD-10-CM

## 2014-09-28 DIAGNOSIS — M25511 Pain in right shoulder: Secondary | ICD-10-CM | POA: Insufficient documentation

## 2014-09-28 DIAGNOSIS — G8929 Other chronic pain: Secondary | ICD-10-CM | POA: Insufficient documentation

## 2014-09-28 DIAGNOSIS — N3946 Mixed incontinence: Secondary | ICD-10-CM | POA: Insufficient documentation

## 2014-09-28 DIAGNOSIS — G4700 Insomnia, unspecified: Secondary | ICD-10-CM | POA: Insufficient documentation

## 2014-09-28 DIAGNOSIS — Z7689 Persons encountering health services in other specified circumstances: Secondary | ICD-10-CM | POA: Insufficient documentation

## 2014-09-28 NOTE — Assessment & Plan Note (Signed)
Patient is obese and has low back pain. Denies sciatica. She would prefer a neurosurgical referral and not be worked up here at this time. Will follow.

## 2014-09-28 NOTE — Assessment & Plan Note (Signed)
I feel, due to H&P, this is trapezius spasm and causing "shoulder pain". Will try Ibuprofen, warm pad for spasm, and flexeril. FU prn worsening/failure to improve.

## 2014-09-28 NOTE — Assessment & Plan Note (Signed)
Patient seeing counselor in [place] at [place] and is prescribed xanax and Seroquel. She is continuing this relationship. Will look at getting a sleep study at next visit.

## 2014-09-28 NOTE — Assessment & Plan Note (Signed)
Discussed acute and chronic issues. Reviewed health maintenance measures, PFSHx, and immunizations. Obtain records from previous facility.   

## 2014-09-28 NOTE — Assessment & Plan Note (Signed)
Referral to Urology placed. Pt is having symptoms of stress/urge incontinence without ever bearing children at a young age. FU in 4 weeks.

## 2014-10-09 ENCOUNTER — Ambulatory Visit: Payer: BLUE CROSS/BLUE SHIELD | Admitting: Nurse Practitioner

## 2014-10-09 DIAGNOSIS — Z0289 Encounter for other administrative examinations: Secondary | ICD-10-CM

## 2014-10-10 ENCOUNTER — Ambulatory Visit: Payer: Self-pay

## 2014-10-15 ENCOUNTER — Ambulatory Visit: Payer: BLUE CROSS/BLUE SHIELD | Admitting: Nurse Practitioner

## 2014-10-31 ENCOUNTER — Ambulatory Visit: Payer: Self-pay | Admitting: Urology

## 2015-03-04 ENCOUNTER — Ambulatory Visit: Payer: BLUE CROSS/BLUE SHIELD | Admitting: Obstetrics & Gynecology

## 2015-03-04 DIAGNOSIS — Z01419 Encounter for gynecological examination (general) (routine) without abnormal findings: Secondary | ICD-10-CM

## 2015-03-27 ENCOUNTER — Encounter: Payer: Self-pay | Admitting: Nurse Practitioner

## 2015-03-27 ENCOUNTER — Ambulatory Visit (INDEPENDENT_AMBULATORY_CARE_PROVIDER_SITE_OTHER): Payer: BLUE CROSS/BLUE SHIELD | Admitting: Nurse Practitioner

## 2015-03-27 VITALS — BP 108/88 | HR 72 | Temp 98.6°F | Ht 61.0 in | Wt 243.4 lb

## 2015-03-27 DIAGNOSIS — J0101 Acute recurrent maxillary sinusitis: Secondary | ICD-10-CM | POA: Diagnosis not present

## 2015-03-27 DIAGNOSIS — M25511 Pain in right shoulder: Secondary | ICD-10-CM | POA: Diagnosis not present

## 2015-03-27 MED ORDER — CYCLOBENZAPRINE HCL 10 MG PO TABS: 10.0000 mg | ORAL_TABLET | Freq: Every day | ORAL | Status: DC

## 2015-03-27 MED ORDER — PREDNISONE 10 MG PO TABS: ORAL_TABLET | ORAL | Status: DC

## 2015-03-27 MED ORDER — DOXYCYCLINE HYCLATE 100 MG PO TABS: 100.0000 mg | ORAL_TABLET | Freq: Two times a day (BID) | ORAL | Status: DC

## 2015-03-27 NOTE — Progress Notes (Signed)
Patient ID: Karen Bolton, female    DOB: [birth year]  Age: 34 y.o. MRN: [id number]  CC: Acute Visit   HPI [name] presents for CC of cough and congestion.   1) 1.5 weeks of coughing and congestion  PNdrip, sinus pressure maxiallary, headaches. Denies ear pain.   Sick contacts-denies Treatment to date: Sudafed Tylenol sinus Advil sinus   2) Right trapezius  Hurts when sleeping  Can't throw a ball like she is used to.  History Noni has a past medical history of Insomnia; History of miscarriage; Anxiety; Depression; and Morbid obesity (HCC).   She has past surgical history that includes Oophorectomy (2010); Cholecystectomy; Wisdom tooth extraction (N/A, 2001); Abdominal hysterectomy; and Appendectomy.   Her family history includes Alcohol abuse in her father; Cancer in her cousin, maternal uncle, and paternal grandmother; Cancer (age of onset: 2) in her maternal aunt; Cancer (age of onset: 73) in her mother; Diabetes in her father; Heart disease in her father; Sleep apnea in her father.She reports that she has never smoked. She has never used smokeless tobacco. She reports that she drinks alcohol. She reports that she does not use illicit drugs.  Outpatient Prescriptions Prior to Visit  Medication Sig Dispense Refill  . ALPRAZolam (XANAX) 0.5 MG tablet     . QUEtiapine (SEROQUEL) 200 MG tablet Take 200 mg by mouth at bedtime.  5  . azithromycin (ZITHROMAX) 250 MG tablet Take 2 tablets by mouth on day 1, take 1 tablet by mouth each day after for 4 days. (Patient not taking: Reported on 03/27/2015) 6 each 0  . cyclobenzaprine (FLEXERIL) 10 MG tablet Take 1 tablet (10 mg total) by mouth at bedtime. (Patient not taking: Reported on 03/27/2015) 30 tablet 0  . phentermine 37.5 MG capsule Take 1 capsule (37.5 mg total) by mouth every morning. (Patient not taking: Reported on 03/27/2015) 30 capsule 0   No facility-administered medications prior to visit.    ROS Review of  Systems  Constitutional: Negative for fever, chills, diaphoresis and fatigue.  HENT: Positive for congestion, postnasal drip, rhinorrhea, sinus pressure, sneezing and sore throat. Negative for ear pain.   Eyes: Negative for pain, discharge and itching.  Respiratory: Negative for chest tightness, shortness of breath and wheezing.   Cardiovascular: Negative for chest pain, palpitations and leg swelling.  Gastrointestinal: Negative for nausea, vomiting and diarrhea.  Musculoskeletal: Positive for myalgias. Negative for back pain, joint swelling, arthralgias and gait problem.  Skin: Negative for rash.  Neurological: Positive for headaches. Negative for dizziness and numbness.  Psychiatric/Behavioral: The patient is not nervous/anxious.    Objective:  BP 108/88 mmHg  Pulse 72  Temp(Src) 98.6 F (37 C) (Oral)  Ht  (1.549 m)  Wt 243 lb 6.4 oz (110.406 kg)  BMI 46.01 kg/m2  Physical Exam  Constitutional: She is oriented to person, place, and time. She appears well-developed and well-nourished. No distress.  HENT:  Head: Normocephalic and atraumatic.  Right Ear: External ear normal.  Left Ear: External ear normal.  Mouth/Throat: No oropharyngeal exudate.  Patient has a lot of tissue in the oropharynx. Tongue depressor is minimally helpful at this point. Oropharynx is clear without exudates.  Eyes: Conjunctivae and EOM are normal. Pupils are equal, round, and reactive to light. Right eye exhibits no discharge. Left eye exhibits no discharge. No scleral icterus.  Neck: Normal range of motion. Neck supple.  Cardiovascular: Normal rate, regular rhythm and normal heart sounds.  Exam reveals no gallop and no friction rub.  No murmur heard. Pulmonary/Chest: Effort normal and breath sounds normal. No respiratory distress. She has no wheezes. She has no rales. She exhibits no tenderness.  Lymphadenopathy:    She has no cervical adenopathy.  Neurological: She is alert and oriented to person,  place, and time. No cranial nerve deficit. She exhibits normal muscle tone. Coordination normal.  Skin: Skin is warm and dry. No rash noted. She is not diaphoretic.  Psychiatric: She has a normal mood and affect. Her behavior is normal. Judgment and thought content normal.   Assessment & Plan:   [name] was seen today for acute visit.  Diagnoses and all orders for this visit:  Right shoulder pain  Acute recurrent maxillary sinusitis  Other orders -     predniSONE (DELTASONE) 10 MG tablet; Take 6 tablets by mouth on days 1-3 with breakfast then decrease by 1 tablet each day until gone. -     cyclobenzaprine (FLEXERIL) 10 MG tablet; Take 1 tablet (10 mg total) by mouth at bedtime. -     doxycycline (VIBRA-TABS) 100 MG tablet; Take 1 tablet (100 mg total) by mouth 2 (two) times daily.  I have discontinued Ms. Prehn's phentermine and azithromycin. I am also having her start on predniSONE and doxycycline. Additionally, I am having her maintain her ALPRAZolam, QUEtiapine, and cyclobenzaprine.  Meds ordered this encounter  Medications  . predniSONE (DELTASONE) 10 MG tablet    Sig: Take 6 tablets by mouth on days 1-3 with breakfast then decrease by 1 tablet each day until gone.    Dispense:  33 tablet    Refill:  0    Order Specific Question:  Supervising Provider    Answer:  Duncan DullULLO, TERESA L [[id number]]  . cyclobenzaprine (FLEXERIL) 10 MG tablet    Sig: Take 1 tablet (10 mg total) by mouth at bedtime.    Dispense:  30 tablet    Refill:  0    Order Specific Question:  Supervising Provider    Answer:  Duncan DullULLO, TERESA L [[id number]]  . doxycycline (VIBRA-TABS) 100 MG tablet    Sig: Take 1 tablet (100 mg total) by mouth 2 (two) times daily.    Dispense:  14 tablet    Refill:  0    Order Specific Question:  Supervising Provider    Answer:  Sherlene ShamsULLO, TERESA L [[id number]]     Follow-up: Return if symptoms worsen or fail to improve.

## 2015-03-27 NOTE — Patient Instructions (Signed)
Prednisone with breakfast or lunch at the latest.  6 tablets on day 1,2, and 3- 5 tablets on day 4, 4 tablets on day 5, 3 tablets on day 6, 2 tablets day 7, 1 tablet on day 8...done! Take tablets all together not spaced out Don't take with NSAIDs (Ibuprofen, Aleve, Naproxen, Meloxicam ect...)  Flexeril at night for shoulder if the prednisone doesn't help with shoulder also.   Fill the doxycyline if prednisone taper after 8 days does not improve your symptoms or fever of 101 or higher.

## 2015-04-04 DIAGNOSIS — J0100 Acute maxillary sinusitis, unspecified: Secondary | ICD-10-CM | POA: Insufficient documentation

## 2015-04-04 NOTE — Assessment & Plan Note (Signed)
Same complaints as in June. Will add Flexeril back to regimen as well as stretching and heat. Follow-up worsening or failure to improve

## 2015-04-04 NOTE — Assessment & Plan Note (Signed)
Due to severity of symptoms we'll add a prednisone taper to regimen. Information given verbally and on AVS. Advised her to try prednisone taper first this was sent to her pharmacy via e- prescription. Patient was given a handout of doxycycline since patient is allergic to penicillins. The present. Prescription was given to her and advised that she fill this if fever or failure improvement of symptoms with prednisone taper 48 hours. Encouraged probiotics if using antibiotic. Follow-up if failure worsening to improve

## 2015-05-19 ENCOUNTER — Ambulatory Visit (INDEPENDENT_AMBULATORY_CARE_PROVIDER_SITE_OTHER): Payer: BLUE CROSS/BLUE SHIELD | Admitting: Family Medicine

## 2015-05-19 ENCOUNTER — Encounter: Payer: Self-pay | Admitting: Family Medicine

## 2015-05-19 VITALS — BP 136/84 | HR 97 | Temp 98.5°F | Ht 61.0 in | Wt 250.2 lb

## 2015-05-19 DIAGNOSIS — J0101 Acute recurrent maxillary sinusitis: Secondary | ICD-10-CM | POA: Diagnosis not present

## 2015-05-19 MED ORDER — DOXYCYCLINE HYCLATE 100 MG PO TABS: 100.0000 mg | ORAL_TABLET | Freq: Two times a day (BID) | ORAL | Status: DC

## 2015-05-19 NOTE — Patient Instructions (Signed)
Nice to see you. Your symptoms are likely related to a sinus infection. We will treat you with doxycycline. If you develop fevers, cough productive of blood, numbness, weakness, worsening headache, or any new or change in symptoms please seek medical attention.

## 2015-05-19 NOTE — Progress Notes (Signed)
Patient ID: Karen Bolton, female   DOB: [birth year], 35 y.o.   MRN: [id number]  Marikay Alar, MD Phone: [phone]  Karen Bolton is a 35 y.o. female who presents today for same-day visit.  Patient notes 1 week of sinus congestion and nasal congestion. Notes her right ear has been hurting her as well. Notes mild frontal headache with this as well. She is unable to blow anything out of her nose. Some postnasal drip. No cough. No fevers. Standing Advil sinus with no improvement. She denies numbness, weakness, and vision changes. Notes her symptoms have not improved. Currently on her period.  PMH: nonsmoker.   ROS see history of present illness  Objective  Physical Exam Filed Vitals:   05/19/15 1607  BP: 136/84  Pulse: 97  Temp: 98.5 F (36.9 C)    Physical Exam  Constitutional: She is well-developed, well-nourished, and in no distress.  HENT:  Head: Normocephalic.  Right Ear: External ear normal.  Left Ear: External ear normal.  Mouth/Throat: Oropharynx is clear and moist. No oropharyngeal exudate.  Normal TMs bilaterally, no mastoid process tenderness, tenderness to percussion of right maxillary sinus  Eyes: Conjunctivae are normal. Pupils are equal, round, and reactive to light.  Neck: Neck supple.  Cardiovascular: Normal rate, regular rhythm and normal heart sounds.  Exam reveals no gallop and no friction rub.   No murmur heard. Pulmonary/Chest: Effort normal and breath sounds normal. No respiratory distress. She has no wheezes. She has no rales.  Lymphadenopathy:    She has no cervical adenopathy.  Neurological: She is alert. Gait normal.  CN 2-12 intact, 5/5 strength in bilateral biceps, triceps, grip, quads, hamstrings, plantar and dorsiflexion, sensation to light touch intact in bilateral UE and LE, normal gait, 2+ patellar reflexes  Skin: Skin is warm and dry. She is not diaphoretic.     Assessment/Plan: Please see individual problem list.  Sinusitis,  acute maxillary Patient's symptoms most likely related to maxillary sinusitis. She is one week in and with no improvement. Headache likely related to sinusitis. Neurologically intact. Ears appear normal. We will treat with doxycycline. Advised not to get pregnant while on this medicine. Given return precautions.    Meds ordered this encounter  Medications  . doxycycline (VIBRA-TABS) 100 MG tablet    Sig: Take 1 tablet (100 mg total) by mouth 2 (two) times daily.    Dispense:  14 tablet    Refill:  0   Marikay Alar

## 2015-05-19 NOTE — Assessment & Plan Note (Signed)
Patient's symptoms most likely related to maxillary sinusitis. She is one week in and with no improvement. Headache likely related to sinusitis. Neurologically intact. Ears appear normal. We will treat with doxycycline. Advised not to get pregnant while on this medicine. Given return precautions.

## 2015-05-19 NOTE — Progress Notes (Signed)
Pre visit review using our clinic review tool, if applicable. No additional management support is needed unless otherwise documented below in the visit note. 

## 2015-05-22 ENCOUNTER — Encounter: Payer: BLUE CROSS/BLUE SHIELD | Admitting: Nurse Practitioner

## 2015-06-03 ENCOUNTER — Encounter: Payer: Self-pay | Admitting: Family Medicine

## 2015-06-03 ENCOUNTER — Other Ambulatory Visit: Payer: Self-pay | Admitting: Family Medicine

## 2015-06-03 ENCOUNTER — Telehealth: Payer: Self-pay

## 2015-06-03 ENCOUNTER — Ambulatory Visit (INDEPENDENT_AMBULATORY_CARE_PROVIDER_SITE_OTHER): Payer: BLUE CROSS/BLUE SHIELD | Admitting: Family Medicine

## 2015-06-03 VITALS — BP 112/82 | HR 89 | Temp 97.7°F | Wt 248.2 lb

## 2015-06-03 DIAGNOSIS — B309 Viral conjunctivitis, unspecified: Secondary | ICD-10-CM | POA: Diagnosis not present

## 2015-06-03 MED ORDER — POLYMYXIN B-TRIMETHOPRIM 10000-0.1 UNIT/ML-% OP SOLN: OPHTHALMIC | Status: DC

## 2015-06-03 NOTE — Progress Notes (Signed)
Patient ID: Karen Bolton, female   DOB: 1980/08/28, 35 y.o.   MRN: [id number]  Karen Alar, MD Phone: [phone]  Lindsay Straka is a 35 y.o. female who presents today for same-day visit.  Patient notes she woke up this morning with nasal drainage, sore throat with postnasal drip, and gunky eyes. Notes her eyes are watery. Notes her eyes are draining gunky material although not pus. No eye pain. No vision changes. She does state she is due to see an eye doctor for glasses, though has not had any acute changes in vision. No foreign body sensation in her eyes. No photophobia. She does note some itching in her eyes. She denies sinus pressure. She denies cough. She denies fever. She notes her daughter had similar symptoms. She reports her symptoms of prior sinus infection had resolved completely.  PMH: nonsmoker.   ROS see history of present illness  Objective  Physical Exam Filed Vitals:   06/03/15 1134  BP: 112/82  Pulse: 89  Temp: 97.7 F (36.5 C)    BP Readings from Last 3 Encounters:  06/03/15 112/82  05/19/15 136/84  03/27/15 108/88   Wt Readings from Last 3 Encounters:  06/03/15 248 lb 4 oz (112.605 kg)  05/19/15 250 lb 3.2 oz (113.49 kg)  03/27/15 243 lb 6.4 oz (110.406 kg)    Physical Exam  Constitutional: She is well-developed, well-nourished, and in no distress.  HENT:  Head: Normocephalic and atraumatic.  Right Ear: External ear normal.  Left Ear: External ear normal.  Mild posterior oropharyngeal erythema, no tonsillar swelling, no uvula swelling, uvula is midline, no tonsillar exudate  Eyes: EOM are normal. Pupils are equal, round, and reactive to light.  Mild conjunctival erythema bilaterally with clear discharge, no obvious corneal lesions on gross visualization, no purulent discharge  Neck: Neck supple.  Cardiovascular: Normal rate, regular rhythm and normal heart sounds.  Exam reveals no gallop and no friction rub.   No murmur  heard. Pulmonary/Chest: Effort normal and breath sounds normal. No respiratory distress. She has no wheezes. She has no rales.  Lymphadenopathy:    She has no cervical adenopathy.  Neurological: She is alert. Gait normal.  Skin: Skin is warm and dry. She is not diaphoretic.     Assessment/Plan: Please see individual problem list.  Viral conjunctivitis Symptoms most consistent with viral conjunctivitis and viral upper respiratory infection. Symptoms are not consistent with keratoconjunctivitis. Not consistent with bacterial conjunctivitis. Could be allergic conjunctivitis. Patient reports vision is stable. Discussed treatment with Claritin, Flonase, and antihistamine eyedrops. She'll continue to monitor. She's given return precautions.    Karen Bolton

## 2015-06-03 NOTE — Telephone Encounter (Signed)
Reason for call: green eye drainage Symptoms: pt c/o green eye drainage, pt states that her daughter has pink eye Duration: today Medications:moisturizing eye drops Last seen for this problem: today Seen by: Dr. Birdie Sons  Please advise, thanks

## 2015-06-03 NOTE — Progress Notes (Signed)
Pre visit review using our clinic review tool, if applicable. No additional management support is needed unless otherwise documented below in the visit note. 

## 2015-06-03 NOTE — Patient Instructions (Addendum)
Nice to see you. Your symptoms are likely related to a viral respiratory illness leading to a viral conjunctivitis. You can treat this with over-the-counter Claritin, Flonase, and antihistamine eyedrops. You can also use moisturizing eyedrops to help with any discomfort. If you develop eye pain, pustular drainage from her eyes, vision changes, light bothers her eyes or or any new or change in symptoms please seek medical attention.

## 2015-06-03 NOTE — Telephone Encounter (Signed)
Notified pt that Rx was sent in, I explained to the pt the she should practice good handwashing while using this medication because of being highly contagious, and to place the eyedrops high up away from the infected eyes or have someone else put them in so she does not contaminate the the tip or re infect her eyes.

## 2015-06-03 NOTE — Assessment & Plan Note (Signed)
Symptoms most consistent with viral conjunctivitis and viral upper respiratory infection. Symptoms are not consistent with keratoconjunctivitis. Not consistent with bacterial conjunctivitis. Could be allergic conjunctivitis. Patient reports vision is stable. Discussed treatment with Claritin, Flonase, and antihistamine eyedrops. She'll continue to monitor. She's given return precautions.

## 2015-06-03 NOTE — Telephone Encounter (Signed)
Rx sent 

## 2015-06-09 ENCOUNTER — Encounter: Payer: Self-pay | Admitting: Emergency Medicine

## 2015-06-09 ENCOUNTER — Emergency Department
Admission: EM | Admit: 2015-06-09 | Discharge: 2015-06-09 | Disposition: A | Discharge status: Home / Self Care | Payer: BLUE CROSS/BLUE SHIELD | Attending: Emergency Medicine | Admitting: Emergency Medicine

## 2015-06-09 ENCOUNTER — Emergency Department: Payer: BLUE CROSS/BLUE SHIELD

## 2015-06-09 DIAGNOSIS — J069 Acute upper respiratory infection, unspecified: Secondary | ICD-10-CM | POA: Diagnosis not present

## 2015-06-09 DIAGNOSIS — Z880 Allergy status to penicillin: Secondary | ICD-10-CM | POA: Diagnosis not present

## 2015-06-09 DIAGNOSIS — Z79899 Other long term (current) drug therapy: Secondary | ICD-10-CM | POA: Diagnosis not present

## 2015-06-09 DIAGNOSIS — R05 Cough: Secondary | ICD-10-CM | POA: Diagnosis present

## 2015-06-09 LAB — POCT RAPID STREP A: Streptococcus, Group A Screen (Direct): NEGATIVE

## 2015-06-09 MED ORDER — CHLORPHENIRAMINE MALEATE 4 MG PO TABS: 4.0000 mg | ORAL_TABLET | Freq: Two times a day (BID) | ORAL | Status: DC | PRN

## 2015-06-09 MED ORDER — GUAIFENESIN-CODEINE 100-10 MG/5ML PO SOLN: 10.0000 mL | ORAL | Status: DC | PRN

## 2015-06-09 MED ORDER — IBUPROFEN 800 MG PO TABS: 800.0000 mg | ORAL_TABLET | Freq: Three times a day (TID) | ORAL | Status: DC | PRN

## 2015-06-09 MED ORDER — OSELTAMIVIR PHOSPHATE 75 MG PO CAPS: 75.0000 mg | ORAL_CAPSULE | Freq: Two times a day (BID) | ORAL | Status: DC

## 2015-06-09 NOTE — Discharge Instructions (Signed)
Upper Respiratory Infection, Adult Most upper respiratory infections (URIs) are a viral infection of the air passages leading to the lungs. A URI affects the nose, throat, and upper air passages. The most common type of URI is nasopharyngitis and is typically referred to as "the common cold." URIs run their course and usually go away on their own. Most of the time, a URI does not require medical attention, but sometimes a bacterial infection in the upper airways can follow a viral infection. This is called a secondary infection. Sinus and middle ear infections are common types of secondary upper respiratory infections. Bacterial pneumonia can also complicate a URI. A URI can worsen asthma and chronic obstructive pulmonary disease (COPD). Sometimes, these complications can require emergency medical care and may be life threatening.  CAUSES Almost all URIs are caused by viruses. A virus is a type of germ and can spread from one person to another.  RISKS FACTORS You may be at risk for a URI if:   You smoke.   You have chronic heart or lung disease.  You have a weakened defense (immune) system.   You are very young or very old.   You have nasal allergies or asthma.  You work in crowded or poorly ventilated areas.  You work in health care facilities or schools. SIGNS AND SYMPTOMS  Symptoms typically develop 2-3 days after you come in contact with a cold virus. Most viral URIs last 7-10 days. However, viral URIs from the influenza virus (flu virus) can last 14-18 days and are typically more severe. Symptoms may include:   Runny or stuffy (congested) nose.   Sneezing.   Cough.   Sore throat.   Headache.   Fatigue.   Fever.   Loss of appetite.   Pain in your forehead, behind your eyes, and over your cheekbones (sinus pain).  Muscle aches.  DIAGNOSIS  Your health care provider may diagnose a URI by:  Physical exam.  Tests to check that your symptoms are not due to  another condition such as:  Strep throat.  Sinusitis.  Pneumonia.  Asthma. TREATMENT  A URI goes away on its own with time. It cannot be cured with medicines, but medicines may be prescribed or recommended to relieve symptoms. Medicines may help:  Reduce your fever.  Reduce your cough.  Relieve nasal congestion. HOME CARE INSTRUCTIONS   Take medicines only as directed by your health care provider.   Gargle warm saltwater or take cough drops to comfort your throat as directed by your health care provider.  Use a warm mist humidifier or inhale steam from a shower to increase air moisture. This may make it easier to breathe.  Drink enough fluid to keep your urine clear or pale yellow.   Eat soups and other clear broths and maintain good nutrition.   Rest as needed.   Return to work when your temperature has returned to normal or as your health care provider advises. You may need to stay home longer to avoid infecting others. You can also use a face mask and careful hand washing to prevent spread of the virus.  Increase the usage of your inhaler if you have asthma.   Do not use any tobacco products, including cigarettes, chewing tobacco, or electronic cigarettes. If you need help quitting, ask your health care provider. PREVENTION  The best way to protect yourself from getting a cold is to practice good hygiene.   Avoid oral or hand contact with people with cold   symptoms.   Wash your hands often if contact occurs.  There is no clear evidence that vitamin C, vitamin E, echinacea, or exercise reduces the chance of developing a cold. However, it is always recommended to get plenty of rest, exercise, and practice good nutrition.  SEEK MEDICAL CARE IF:   You are getting worse rather than better.   Your symptoms are not controlled by medicine.   You have chills.  You have worsening shortness of breath.  You have brown or red mucus.  You have yellow or brown nasal  discharge.  You have pain in your face, especially when you bend forward.  You have a fever.  You have swollen neck glands.  You have pain while swallowing.  You have white areas in the back of your throat. SEEK IMMEDIATE MEDICAL CARE IF:   You have severe or persistent:  Headache.  Ear pain.  Sinus pain.  Chest pain.  You have chronic lung disease and any of the following:  Wheezing.  Prolonged cough.  Coughing up blood.  A change in your usual mucus.  You have a stiff neck.  You have changes in your:  Vision.  Hearing.  Thinking.  Mood. MAKE SURE YOU:   Understand these instructions.  Will watch your condition.  Will get help right away if you are not doing well or get worse.   This information is not intended to replace advice given to you by your health care provider. Make sure you discuss any questions you have with your health care provider.   Document Released: 09/22/2000 Document Revised: 08/13/2014 Document Reviewed: 07/04/2013 Elsevier Interactive Patient Education 2016 [employer]  

## 2015-06-09 NOTE — ED Provider Notes (Signed)
[facility] Emergency Department Provider Note  ____________________________________________  Time seen: Approximately 3:32 PM  I have reviewed the triage vital signs and the nursing notes.   HISTORY  Chief Complaint Cough    HPI Karen Bolton is a 35 y.o. female who presents with a sudden onset of fever chills body aches sore throat and coughing. Patient states that she recently got over a sinus infection and now feels like it's moved down into her chest and throat. States that her cough is productive with yellow sputum. Describes her body aches as 8/10 at this time. Some relief with over-the-counter Tylenol noted. Patient reports that she's been exposed to someone with a positive flu test.   Past Medical History  Diagnosis Date  . Insomnia   . History of miscarriage     x2  . Anxiety   . Depression   . Morbid obesity [facility])     Patient Active Problem List   Diagnosis Date Noted  . Viral conjunctivitis 06/03/2015  . Sinusitis, acute maxillary 04/04/2015  . Chronic low back pain 09/28/2014  . Mixed incontinence 09/28/2014  . Encounter to establish care 09/28/2014  . Insomnia 09/28/2014  . Right shoulder pain 09/28/2014  . Contraception, device intrauterine 11/04/2011  . Normal Pap smear, HPV DNA positive 11/02/2011    Past Surgical History  Procedure Laterality Date  . Oophorectomy  2010    right ovary removed  . Cholecystectomy    . Wisdom tooth extraction N/A 2001  . Abdominal hysterectomy      Right ovary removed  . Appendectomy      2016    Current Outpatient Rx  Name  Route  Sig  Dispense  Refill  . ALPRAZolam (XANAX) 0.5 MG tablet               . chlorpheniramine (CHLOR-TRIMETON) 4 MG tablet   Oral   Take 1 tablet (4 mg total) by mouth 2 (two) times daily as needed for allergies or rhinitis.   30 tablet   0   . guaiFENesin-codeine 100-10 MG/5ML syrup   Oral   Take 10 mLs by mouth every 4 (four) hours as needed for  cough.   180 mL   0   . ibuprofen (ADVIL,MOTRIN) 800 MG tablet   Oral   Take 1 tablet (800 mg total) by mouth every 8 (eight) hours as needed.   30 tablet   0   . oseltamivir (TAMIFLU) 75 MG capsule   Oral   Take 1 capsule (75 mg total) by mouth 2 (two) times daily.   10 capsule   0   . QUEtiapine (SEROQUEL) 200 MG tablet   Oral   Take 200 mg by mouth at bedtime.      5   . trimethoprim-polymyxin b (POLYTRIM) ophthalmic solution      1 drop to affected eye 4 times daily for 5-7 days.   10 mL   0     Allergies Penicillins and Dilaudid  Family History  Problem Relation Age of Onset  . Cancer Mother 34    Breast Cancer  . Cancer Maternal Aunt 35    ovarian Cancer  . Cancer Maternal Uncle     pancreatic cancer  . Heart disease Father   . Diabetes Father   . Sleep apnea Father   . Alcohol abuse Father   . Cancer Paternal Grandmother     breast cancer  . Cancer Cousin     breast cancer  Social History Social History  Substance Use Topics  . Smoking status: Never Smoker   . Smokeless tobacco: Never Used  . Alcohol Use: 0.0 oz/week    0 Standard drinks or equivalent per week     Comment: rare    Review of Systems Constitutional: Positive for fever/chills Eyes: No visual changes. ENT: Positive sore throat. Cardiovascular: Denies chest pain. Respiratory: Denies shortness of breath. Positive for productive cough greenish sputum Gastrointestinal: No abdominal pain.  No nausea, no vomiting.  No diarrhea.  No constipation. Genitourinary: Negative for dysuria. Musculoskeletal: Negative for back pain. Positive for generalized body aches. Skin: Negative for rash. Neurological: Positive for headaches.  10-point ROS otherwise negative.  ____________________________________________   PHYSICAL EXAM:  VITAL SIGNS: ED Triage Vitals  Enc Vitals Group     BP 06/09/15 1510 104/81 mmHg     Pulse Rate 06/09/15 1510 86     Resp 06/09/15 1510 16     Temp  06/09/15 1510 98.3 F (36.8 C)     Temp Source 06/09/15 1510 Oral     SpO2 06/09/15 1510 97 %     Weight 06/09/15 1510 240 lb (108.863 kg)     Height 06/09/15 1510  (1.549 m)     Head Cir --      Peak Flow --      Pain Score 06/09/15 1512 9     Pain Loc --      Pain Edu? --      Excl. in GC? --     Constitutional: Alert and oriented. Well appearing and in no acute distress. Nose: Positive congestion/rhinnorhea with mild turbinate edema noted.. Mouth/Throat: Mucous membranes are moist.  Oropharynx non-erythematous. Neck: No stridor. Full range of motion nontender.   Cardiovascular: Normal rate, regular rhythm. Grossly normal heart sounds.  Good peripheral circulation. Respiratory: Normal respiratory effort.  No retractions. Lungs CTAB. Musculoskeletal: No lower extremity tenderness nor edema.  No joint effusions. Neurologic:  Normal speech and language. No gross focal neurologic deficits are appreciated. No gait instability. Skin:  Skin is warm, dry and intact. No rash noted. Psychiatric: Mood and affect are normal. Speech and behavior are normal.  ____________________________________________   LABS (all labs ordered are listed, but only abnormal results are displayed)  Labs Reviewed  POCT RAPID STREP A    RADIOLOGY  Negative for any acute cardiopulmonary processes disease noted. ____________________________________________   PROCEDURES  Procedure(s) performed: None  Critical Care performed: No  ____________________________________________   INITIAL IMPRESSION / ASSESSMENT AND PLAN / ED COURSE  Pertinent labs & imaging results that were available during my care of the patient were reviewed by me and considered in my medical decision making (see chart for details).  Acute upper respiratory infection. Probable influenza. Due to the symptoms will treat with Tamiflu 75 mg, Robitussin-AC Motrin 800 mg and chlorpheniramine. Work excuse given 3 days. Patient to  follow up with PCP or return to the ER with any worsening symptomology. ____________________________________________   FINAL CLINICAL IMPRESSION(S) / ED DIAGNOSES  Final diagnoses:  URI, acute     This chart was dictated using voice recognition software/Dragon. Despite best efforts to proofread, errors can occur which can change the meaning. Any change was purely unintentional.   Evangeline Dakin, PA-C 06/09/15 1652  Emily Filbert, MD 06/09/15 [phone]

## 2015-06-09 NOTE — ED Notes (Signed)
C/o cough, sore throat and bodyaches.

## 2015-06-12 ENCOUNTER — Encounter: Payer: Self-pay | Admitting: Nurse Practitioner

## 2015-06-12 ENCOUNTER — Ambulatory Visit (INDEPENDENT_AMBULATORY_CARE_PROVIDER_SITE_OTHER): Payer: BLUE CROSS/BLUE SHIELD | Admitting: Nurse Practitioner

## 2015-06-12 VITALS — BP 122/76 | HR 90 | Temp 98.3°F | Resp 16 | Ht 61.0 in | Wt 247.6 lb

## 2015-06-12 DIAGNOSIS — Z1329 Encounter for screening for other suspected endocrine disorder: Secondary | ICD-10-CM | POA: Diagnosis not present

## 2015-06-12 DIAGNOSIS — Z130 Encounter for screening for diseases of the blood and blood-forming organs and certain disorders involving the immune mechanism: Secondary | ICD-10-CM

## 2015-06-12 DIAGNOSIS — Z13220 Encounter for screening for lipoid disorders: Secondary | ICD-10-CM

## 2015-06-12 DIAGNOSIS — Z131 Encounter for screening for diabetes mellitus: Secondary | ICD-10-CM | POA: Diagnosis not present

## 2015-06-12 DIAGNOSIS — J069 Acute upper respiratory infection, unspecified: Secondary | ICD-10-CM

## 2015-06-12 MED ORDER — HYDROCOD POLST-CPM POLST ER 10-8 MG/5ML PO SUER: 5.0000 mL | Freq: Every evening | ORAL | Status: DC | PRN

## 2015-06-12 MED ORDER — BENZONATATE 100 MG PO CAPS
100.0000 mg | ORAL_CAPSULE | Freq: Two times a day (BID) | ORAL | Status: DC | PRN
Start: 2015-06-12 — End: 2015-07-30

## 2015-06-12 NOTE — Patient Instructions (Addendum)
Please visit the lab before leaving.   Tessalon perles during the day for the cough- sent to your pharmacy  5 mL (1 teaspoon) of cough syrup. Don't drive or make important decisions while taking this....just sleep and rest.   Please re-schedule your physical- your labs will already be done by that point.

## 2015-06-12 NOTE — Progress Notes (Signed)
Patient ID: Karen Bolton, female    DOB: [birth year]  Age: 35 y.o. MRN: [id number]  CC: Follow-up   HPI Karen Bolton presents for follow up of an Acute URI  1) Pt was seen in the ED on 06/09/15 for flu like symptoms.  Rapid strep was negative  She was afebrile in the ED Pt was given Tamiflu 75 mg, Robitussin-AC, Motrin 800 mg   Pt feels poorly still. It has only been a few days out Poor appetite Needs another work note she reports is why she is following up    History Cleatus has a past medical history of Insomnia; History of miscarriage; Anxiety; Depression; and Morbid obesity (HCC).   She has past surgical history that includes Oophorectomy (2010); Cholecystectomy; Wisdom tooth extraction (N/A, 2001); Abdominal hysterectomy; and Appendectomy.   Her family history includes Alcohol abuse in her father; Cancer in her cousin, maternal uncle, and paternal grandmother; Cancer (age of onset: 40) in her maternal aunt; Cancer (age of onset: 67) in her mother; Diabetes in her father; Heart disease in her father; Sleep apnea in her father.She reports that she has never smoked. She has never used smokeless tobacco. She reports that she drinks alcohol. She reports that she does not use illicit drugs.  Outpatient Prescriptions Prior to Visit  Medication Sig Dispense Refill  . ALPRAZolam (XANAX) 0.5 MG tablet     . chlorpheniramine (CHLOR-TRIMETON) 4 MG tablet Take 1 tablet (4 mg total) by mouth 2 (two) times daily as needed for allergies or rhinitis. 30 tablet 0  . ibuprofen (ADVIL,MOTRIN) 800 MG tablet Take 1 tablet (800 mg total) by mouth every 8 (eight) hours as needed. 30 tablet 0  . oseltamivir (TAMIFLU) 75 MG capsule Take 1 capsule (75 mg total) by mouth 2 (two) times daily. 10 capsule 0  . QUEtiapine (SEROQUEL) 200 MG tablet Take 200 mg by mouth at bedtime.  5  . trimethoprim-polymyxin b (POLYTRIM) ophthalmic solution 1 drop to affected eye 4 times daily for 5-7 days. 10 mL 0  .  guaiFENesin-codeine 100-10 MG/5ML syrup Take 10 mLs by mouth every 4 (four) hours as needed for cough. 180 mL 0   No facility-administered medications prior to visit.    ROS Review of Systems  Constitutional: Positive for fever, diaphoresis, appetite change and fatigue. Negative for chills.       Poor appetite  HENT: Positive for congestion.   Respiratory: Positive for cough. Negative for chest tightness, shortness of breath and wheezing.   Cardiovascular: Negative for chest pain, palpitations and leg swelling.  Gastrointestinal: Negative for nausea, vomiting and diarrhea.  Skin: Negative for rash.  Neurological: Positive for headaches. Negative for dizziness.  Psychiatric/Behavioral: The patient is nervous/anxious.     Objective:  BP 122/76 mmHg  Pulse 90  Temp(Src) 98.3 F (36.8 C) (Oral)  Resp 16  Ht  (1.549 m)  Wt 247 lb 9.6 oz (112.311 kg)  BMI 46.81 kg/m2  SpO2 95%  LMP 05/19/2015  Physical Exam  Constitutional: She is oriented to person, place, and time. She appears well-developed and well-nourished. No distress.  HENT:  Head: Normocephalic and atraumatic.  Right Ear: External ear normal.  Left Ear: External ear normal.  Mouth/Throat: Oropharynx is clear and moist. No oropharyngeal exudate.  Nares very edematous Pt sounds very nasal- more so than usual  Eyes: EOM are normal. Pupils are equal, round, and reactive to light. Right eye exhibits no discharge. Left eye exhibits no discharge. No scleral icterus.  Neck:  Normal range of motion. Neck supple.  Cardiovascular: Normal rate and regular rhythm.  Exam reveals no gallop and no friction rub.   Pulmonary/Chest: Effort normal and breath sounds normal. No respiratory distress. She has no wheezes. She has no rales. She exhibits no tenderness.  Lymphadenopathy:    She has no cervical adenopathy.  Neurological: She is alert and oriented to person, place, and time. No cranial nerve deficit. She exhibits normal muscle  tone. Coordination normal.  Skin: Skin is warm and dry. No rash noted. She is not diaphoretic.  Psychiatric: She has a normal mood and affect. Her behavior is normal. Judgment and thought content normal.   Assessment & Plan:   Brook was seen today for follow-up.  Diagnoses and all orders for this visit:  Screening for diabetes mellitus -     Comprehensive metabolic panel -     Hemoglobin A1c  Screening for thyroid disorder -     Comprehensive metabolic panel -     TSH  Screening for deficiency anemia -     CBC w/Diff  Screening for hyperlipidemia -     Lipid panel  Acute URI  Other orders -     chlorpheniramine-HYDROcodone (TUSSIONEX PENNKINETIC ER) 10-8 MG/5ML SUER; Take 5 mLs by mouth at bedtime as needed for cough. -     benzonatate (TESSALON) 100 MG capsule; Take 1 capsule (100 mg total) by mouth 2 (two) times daily as needed for cough.   I have discontinued Ms. Kimbrell's guaiFENesin-codeine. I am also having her start on chlorpheniramine-HYDROcodone and benzonatate. Additionally, I am having her maintain her ALPRAZolam, QUEtiapine, trimethoprim-polymyxin b, oseltamivir, ibuprofen, and chlorpheniramine.  Meds ordered this encounter  Medications  . chlorpheniramine-HYDROcodone (TUSSIONEX PENNKINETIC ER) 10-8 MG/5ML SUER    Sig: Take 5 mLs by mouth at bedtime as needed for cough.    Dispense:  115 mL    Refill:  0    Order Specific Question:  Supervising Provider    Answer:  Darrick Huntsman, TERESA L [[id number]]  . benzonatate (TESSALON) 100 MG capsule    Sig: Take 1 capsule (100 mg total) by mouth 2 (two) times daily as needed for cough.    Dispense:  20 capsule    Refill:  0    Order Specific Question:  Supervising Provider    Answer:  Sherlene Shams [[id number]]     Follow-up: Return if symptoms worsen or fail to improve.

## 2015-06-13 ENCOUNTER — Telehealth: Payer: Self-pay | Admitting: Nurse Practitioner

## 2015-06-13 ENCOUNTER — Other Ambulatory Visit: Payer: BLUE CROSS/BLUE SHIELD

## 2015-06-13 LAB — COMPREHENSIVE METABOLIC PANEL
ALT: 23 U/L (ref 0–35)
AST: 25 U/L (ref 0–37)
Albumin: 4.1 g/dL (ref 3.5–5.2)
Alkaline Phosphatase: 69 U/L (ref 39–117)
BILIRUBIN TOTAL: 0.2 mg/dL (ref 0.2–1.2)
BUN: 14 mg/dL (ref 6–23)
CALCIUM: 9.8 mg/dL (ref 8.4–10.5)
CO2: 23 mEq/L (ref 19–32)
Chloride: 104 mEq/L (ref 96–112)
Creatinine, Ser: 0.78 mg/dL (ref 0.40–1.20)
GFR: 88.76 mL/min (ref >60.00)
GLUCOSE: 93 mg/dL (ref 70–99)
Potassium: 4 mEq/L (ref 3.5–5.1)
Sodium: 136 mEq/L (ref 135–145)
TOTAL PROTEIN: 7.6 g/dL (ref 6.0–8.3)

## 2015-06-13 LAB — LIPID PANEL
CHOL/HDL RATIO: 4
Cholesterol: 146 mg/dL (ref 0–200)
HDL: 38.4 mg/dL — ABNORMAL LOW (ref >39.00)
LDL CALC: 77 mg/dL (ref 0–99)
NonHDL: 107.21
TRIGLYCERIDES: 151 mg/dL — AB (ref 0.0–149.0)
VLDL: 30.2 mg/dL (ref 0.0–40.0)

## 2015-06-13 LAB — TSH: TSH: 4.85 u[IU]/mL — ABNORMAL HIGH (ref 0.35–4.50)

## 2015-06-13 NOTE — Telephone Encounter (Addendum)
Lab called going to need a recollect for and A1c and CBC not enough blood. Called patient coming in for recollect 2, please add to lab schedule.

## 2015-06-18 ENCOUNTER — Other Ambulatory Visit: Payer: Self-pay | Admitting: Nurse Practitioner

## 2015-06-18 DIAGNOSIS — R7989 Other specified abnormal findings of blood chemistry: Secondary | ICD-10-CM

## 2015-06-20 DIAGNOSIS — J069 Acute upper respiratory infection, unspecified: Secondary | ICD-10-CM | POA: Insufficient documentation

## 2015-06-20 NOTE — Assessment & Plan Note (Signed)
New problem to me from ED Will treat conservatively due to probable viral nature Tussionex syrup printed, signed, and given to pt to take to pharmacy  Cautioned on drowsy effects and how much to take  Pt didn't lke the guaifenesin-codeine syrup  Tessalon perles sent to pharmacy FU prn worsening/failure to improve.

## 2015-07-16 ENCOUNTER — Encounter: Payer: BLUE CROSS/BLUE SHIELD | Admitting: Nurse Practitioner

## 2015-07-30 ENCOUNTER — Ambulatory Visit (INDEPENDENT_AMBULATORY_CARE_PROVIDER_SITE_OTHER): Payer: BLUE CROSS/BLUE SHIELD | Admitting: Nurse Practitioner

## 2015-07-30 ENCOUNTER — Encounter: Payer: Self-pay | Admitting: Nurse Practitioner

## 2015-07-30 ENCOUNTER — Other Ambulatory Visit (HOSPITAL_COMMUNITY)
Admission: RE | Admit: 2015-07-30 | Discharge: 2015-07-30 | Disposition: A | Discharge status: Home / Self Care | Payer: BLUE CROSS/BLUE SHIELD | Source: Ambulatory Visit | Attending: Nurse Practitioner | Admitting: Nurse Practitioner

## 2015-07-30 VITALS — BP 112/70 | HR 89 | Temp 97.9°F | Ht 60.75 in | Wt 250.2 lb

## 2015-07-30 DIAGNOSIS — Z131 Encounter for screening for diabetes mellitus: Secondary | ICD-10-CM | POA: Diagnosis not present

## 2015-07-30 DIAGNOSIS — R631 Polydipsia: Secondary | ICD-10-CM

## 2015-07-30 DIAGNOSIS — Z1151 Encounter for screening for human papillomavirus (HPV): Secondary | ICD-10-CM | POA: Diagnosis not present

## 2015-07-30 DIAGNOSIS — Z01419 Encounter for gynecological examination (general) (routine) without abnormal findings: Secondary | ICD-10-CM | POA: Insufficient documentation

## 2015-07-30 DIAGNOSIS — Z0001 Encounter for general adult medical examination with abnormal findings: Secondary | ICD-10-CM

## 2015-07-30 DIAGNOSIS — E669 Obesity, unspecified: Secondary | ICD-10-CM

## 2015-07-30 DIAGNOSIS — R946 Abnormal results of thyroid function studies: Secondary | ICD-10-CM | POA: Diagnosis not present

## 2015-07-30 DIAGNOSIS — M722 Plantar fascial fibromatosis: Secondary | ICD-10-CM | POA: Diagnosis not present

## 2015-07-30 DIAGNOSIS — Z0000 Encounter for general adult medical examination without abnormal findings: Secondary | ICD-10-CM

## 2015-07-30 DIAGNOSIS — R7989 Other specified abnormal findings of blood chemistry: Secondary | ICD-10-CM

## 2015-07-30 DIAGNOSIS — N3946 Mixed incontinence: Secondary | ICD-10-CM

## 2015-07-30 LAB — T4, FREE: Free T4: 0.81 ng/dL (ref 0.60–1.60)

## 2015-07-30 LAB — TSH: TSH: 5.09 u[IU]/mL — ABNORMAL HIGH (ref 0.35–4.50)

## 2015-07-30 LAB — HEMOGLOBIN A1C: HEMOGLOBIN A1C: 5.6 % (ref 4.6–6.5)

## 2015-07-30 MED ORDER — PHENTERMINE HCL 37.5 MG PO TABS: 37.5000 mg | ORAL_TABLET | Freq: Every day | ORAL | Status: DC

## 2015-07-30 NOTE — Progress Notes (Signed)
'Patient ID: Karen Bolton, female    DOB: [birth year]  Age: 35 y.o. MRN: [id number]  CC: Annual Exam   HPI Karen Bolton presents for Annual Exam W/ PAP.   1) Annual Physical   Diet- No formal   Exercise- No formal   Immunizations- UTD  Eye Exam- UTD  Dental Exam- UTD  LMP- Every 3 years   Labs- Today TSH and A1c   Depression- Seeing a counselor and on medications, controlled at this time   Refills: Phentermine, last used in June 2016   Aunts have cervical cancer and she would like to have a PAP today  History Karen Bolton has a past medical history of Insomnia; History of miscarriage; Anxiety; Depression; and Morbid obesity (HCC).   She has past surgical history that includes Oophorectomy (2010); Cholecystectomy; Wisdom tooth extraction (N/A, 2001); Abdominal hysterectomy; and Appendectomy.   Karen Bolton family history includes Alcohol abuse in Karen Bolton father; Cancer in Karen Bolton cousin, maternal uncle, and paternal grandmother; Cancer (age of onset: 65) in Karen Bolton maternal aunt; Cancer (age of onset: 3) in Karen Bolton mother; Diabetes in Karen Bolton father; Heart disease in Karen Bolton father; Sleep apnea in Karen Bolton father.She reports that she has never smoked. She has never used smokeless tobacco. She reports that she drinks alcohol. She reports that she does not use illicit drugs.  Outpatient Prescriptions Prior to Visit  Medication Sig Dispense Refill  . ALPRAZolam (XANAX) 0.5 MG tablet     . QUEtiapine (SEROQUEL) 200 MG tablet Take 200 mg by mouth at bedtime.  5  . benzonatate (TESSALON) 100 MG capsule Take 1 capsule (100 mg total) by mouth 2 (two) times daily as needed for cough. 20 capsule 0  . chlorpheniramine (CHLOR-TRIMETON) 4 MG tablet Take 1 tablet (4 mg total) by mouth 2 (two) times daily as needed for allergies or rhinitis. 30 tablet 0  . chlorpheniramine-HYDROcodone (TUSSIONEX PENNKINETIC ER) 10-8 MG/5ML SUER Take 5 mLs by mouth at bedtime as needed for cough. 115 mL 0  . ibuprofen (ADVIL,MOTRIN) 800 MG tablet Take 1  tablet (800 mg total) by mouth every 8 (eight) hours as needed. 30 tablet 0  . oseltamivir (TAMIFLU) 75 MG capsule Take 1 capsule (75 mg total) by mouth 2 (two) times daily. 10 capsule 0  . trimethoprim-polymyxin b (POLYTRIM) ophthalmic solution 1 drop to affected eye 4 times daily for 5-7 days. 10 mL 0   No facility-administered medications prior to visit.    ROS Review of Systems  Constitutional: Negative for fever, chills, diaphoresis, activity change, appetite change, fatigue and unexpected weight change.  HENT: Negative for tinnitus and trouble swallowing.   Eyes: Negative for visual disturbance.  Respiratory: Negative for chest tightness, shortness of breath and wheezing.   Cardiovascular: Positive for leg swelling. Negative for chest pain and palpitations.  Gastrointestinal: Negative for nausea, vomiting, abdominal pain, diarrhea, constipation and blood in stool.  Endocrine: Negative for polydipsia, polyphagia and polyuria.  Genitourinary: Negative for dysuria, hematuria, vaginal discharge and vaginal pain.  Musculoskeletal: Positive for arthralgias. Negative for myalgias, back pain and gait problem.       Foot pain in the arch to heel Bilaterally  Skin: Negative for color change and rash.  Neurological: Negative for dizziness, weakness, numbness and headaches.  Hematological: Does not bruise/bleed easily.  Psychiatric/Behavioral: Negative for suicidal ideas and sleep disturbance. The patient is not nervous/anxious.     Objective:  BP 112/70 mmHg  Pulse 89  Temp(Src) 97.9 F (36.6 C) (Oral)  Ht 5' 0.75" (1.543 m)  Wt 250 lb 3.2 oz (113.49 kg)  BMI 47.67 kg/m2  SpO2 97%  LMP 06/11/2015  Physical Exam  Constitutional: She is oriented to person, place, and time. She appears well-developed and well-nourished. No distress.  HENT:  Head: Normocephalic and atraumatic.  Right Ear: External ear normal.  Left Ear: External ear normal.  Nose: Nose normal.  Mouth/Throat:  Oropharynx is clear and moist. No oropharyngeal exudate.  TMs and canals clear bilaterally  Eyes: Conjunctivae and EOM are normal. Pupils are equal, round, and reactive to light. Right eye exhibits no discharge. Left eye exhibits no discharge. No scleral icterus.  Neck: Normal range of motion. Neck supple. No thyromegaly present.  Cardiovascular: Normal rate, regular rhythm, normal heart sounds and intact distal pulses.  Exam reveals no gallop and no friction rub.   No murmur heard. Pulmonary/Chest: Effort normal and breath sounds normal. No respiratory distress. She has no wheezes. She has no rales. She exhibits no tenderness.  Breast exam without significant findings  Abdominal: Soft. Bowel sounds are normal. She exhibits no distension and no mass. There is no tenderness. There is no rebound and no guarding.  Morbidly obese  Genitourinary: Vagina normal and uterus normal. No vaginal discharge found.  Musculoskeletal: Normal range of motion. She exhibits no edema or tenderness.  Lymphadenopathy:    She has no cervical adenopathy.  Neurological: She is alert and oriented to person, place, and time. She has normal reflexes. No cranial nerve deficit. She exhibits normal muscle tone. Coordination normal.  Skin: Skin is warm and dry. No rash noted. She is not diaphoretic. No erythema. No pallor.  Psychiatric: She has a normal mood and affect. Karen Bolton behavior is normal. Judgment and thought content normal.   Assessment & Plan:   Karen Bolton was seen today for annual exam.  Diagnoses and all orders for this visit:  Screening for diabetes mellitus -     HgB A1c  Polydipsia -     HgB A1c  Encounter for routine gynecological examination -     Cytology - PAP  Elevated TSH -     TSH -     T4, free  Obese  Routine general medical examination at a health care facility  Mixed incontinence  Other orders -     phentermine (ADIPEX-P) 37.5 MG tablet; Take 1 tablet (37.5 mg total) by mouth daily  before breakfast.   I have discontinued Karen Bolton's trimethoprim-polymyxin b, oseltamivir, ibuprofen, chlorpheniramine, chlorpheniramine-HYDROcodone, and benzonatate. I am also having Karen Bolton start on phentermine. Additionally, I am having Karen Bolton maintain Karen Bolton ALPRAZolam, QUEtiapine, and sertraline.  Meds ordered this encounter  Medications  . sertraline (ZOLOFT) 50 MG tablet    Sig: Take 50 mg by mouth. Take 25mg  daily  . phentermine (ADIPEX-P) 37.5 MG tablet    Sig: Take 1 tablet (37.5 mg total) by mouth daily before breakfast.    Dispense:  30 tablet    Refill:  0    Order Specific Question:  Supervising Provider    Answer:  Sherlene ShamsULLO, TERESA L [[id number]]     Follow-up: Return in about 4 weeks (around 08/27/2015) for Medication follow up .

## 2015-07-30 NOTE — Patient Instructions (Addendum)
Plantar Fasciitis Plantar fasciitis is a painful foot condition that affects the heel. It occurs when the band of tissue that connects the toes to the heel bone (plantar fascia) becomes irritated. This can happen after exercising too much or doing other repetitive activities (overuse injury). The pain from plantar fasciitis can range from mild irritation to severe pain that makes it difficult for you to walk or move. The pain is usually worse in the morning or after you have been sitting or lying down for a while. CAUSES This condition may be caused by:  Standing for long periods of time.  Wearing shoes that do not fit.  Doing high-impact activities, including running, aerobics, and ballet.  Being overweight.  Having an abnormal way of walking (gait).  Having tight calf muscles.  Having high arches in your feet.  Starting a new athletic activity. SYMPTOMS The main symptom of this condition is heel pain. Other symptoms include:  Pain that gets worse after activity or exercise.  Pain that is worse in the morning or after resting.  Pain that goes away after you walk for a few minutes. DIAGNOSIS This condition may be diagnosed based on your signs and symptoms. Your health care provider will also do a physical exam to check for:  A tender area on the bottom of your foot.  A high arch in your foot.  Pain when you move your foot.  Difficulty moving your foot. You may also need to have imaging studies to confirm the diagnosis. These can include:  X-rays.  Ultrasound.  MRI. TREATMENT  Treatment for plantar fasciitis depends on the severity of the condition. Your treatment may include:  Rest, ice, and over-the-counter pain medicines to manage your pain.  Exercises to stretch your calves and your plantar fascia.  A splint that holds your foot in a stretched, upward position while you sleep (night splint).  Physical therapy to relieve symptoms and prevent problems in the  future.  Cortisone injections to relieve severe pain.  Extracorporeal shock wave therapy (ESWT) to stimulate damaged plantar fascia with electrical impulses. It is often used as a last resort before surgery.  Surgery, if other treatments have not worked after 12 months. HOME CARE INSTRUCTIONS  Take medicines only as directed by your health care provider.  Avoid activities that cause pain.  Roll the bottom of your foot over a bag of ice or a bottle of cold water. Do this for 20 minutes, 3-4 times a day.  Perform simple stretches as directed by your health care provider.  Try wearing athletic shoes with air-sole or gel-sole cushions or soft shoe inserts.  Wear a night splint while sleeping, if directed by your health care provider.  Keep all follow-up appointments with your health care provider. PREVENTION   Do not perform exercises or activities that cause heel pain.  Consider finding low-impact activities if you continue to have problems.  Lose weight if you need to. The best way to prevent plantar fasciitis is to avoid the activities that aggravate your plantar fascia. SEEK MEDICAL CARE IF:  Your symptoms do not go away after treatment with home care measures.  Your pain gets worse.  Your pain affects your ability to move or do your daily activities.   This information is not intended to replace advice given to you by your health care provider. Make sure you discuss any questions you have with your health care provider.   Document Released: 12/22/2000 Document Revised: 12/18/2014 Document Reviewed: 02/06/2014 Elsevier   Interactive Patient Education 2016 [employer]  

## 2015-07-31 ENCOUNTER — Other Ambulatory Visit: Payer: BLUE CROSS/BLUE SHIELD

## 2015-07-31 LAB — CYTOLOGY - PAP

## 2015-08-01 ENCOUNTER — Other Ambulatory Visit: Payer: Self-pay | Admitting: Nurse Practitioner

## 2015-08-01 DIAGNOSIS — E038 Other specified hypothyroidism: Secondary | ICD-10-CM

## 2015-08-01 DIAGNOSIS — E039 Hypothyroidism, unspecified: Secondary | ICD-10-CM

## 2015-08-09 DIAGNOSIS — E669 Obesity, unspecified: Secondary | ICD-10-CM | POA: Insufficient documentation

## 2015-08-09 DIAGNOSIS — M722 Plantar fascial fibromatosis: Secondary | ICD-10-CM | POA: Insufficient documentation

## 2015-08-09 NOTE — Assessment & Plan Note (Signed)
Repeat Thyroid panel today

## 2015-08-09 NOTE — Assessment & Plan Note (Signed)
Stable at this time 

## 2015-08-09 NOTE — Assessment & Plan Note (Signed)
PAP and breast exam today w/out significant findings

## 2015-08-09 NOTE — Assessment & Plan Note (Signed)
Pt reports being thirsty often, denies other polys A1c today

## 2015-08-09 NOTE — Assessment & Plan Note (Signed)
Likely etiology PF pt is not wearing proper footwear Handout given and discussed w/ pt  Conservative measures at this time

## 2015-08-09 NOTE — Assessment & Plan Note (Signed)
Discussed acute and chronic issues. Reviewed health maintenance measures, PFSHx, and immunizations.   Health Maintenance was made UTD today Pt declined HIV screening

## 2015-08-21 ENCOUNTER — Encounter: Payer: Self-pay | Admitting: Nurse Practitioner

## 2015-08-21 ENCOUNTER — Ambulatory Visit (INDEPENDENT_AMBULATORY_CARE_PROVIDER_SITE_OTHER): Payer: BLUE CROSS/BLUE SHIELD | Admitting: Nurse Practitioner

## 2015-08-21 VITALS — BP 118/72 | HR 90 | Temp 98.2°F | Resp 16 | Ht 60.75 in | Wt 248.0 lb

## 2015-08-21 DIAGNOSIS — M722 Plantar fascial fibromatosis: Secondary | ICD-10-CM

## 2015-08-21 DIAGNOSIS — E669 Obesity, unspecified: Secondary | ICD-10-CM | POA: Diagnosis not present

## 2015-08-21 DIAGNOSIS — E039 Hypothyroidism, unspecified: Secondary | ICD-10-CM | POA: Insufficient documentation

## 2015-08-21 DIAGNOSIS — R946 Abnormal results of thyroid function studies: Secondary | ICD-10-CM

## 2015-08-21 DIAGNOSIS — E038 Other specified hypothyroidism: Secondary | ICD-10-CM | POA: Insufficient documentation

## 2015-08-21 DIAGNOSIS — R7989 Other specified abnormal findings of blood chemistry: Secondary | ICD-10-CM

## 2015-08-21 MED ORDER — PHENTERMINE HCL 37.5 MG PO TABS
37.5000 mg | ORAL_TABLET | Freq: Every day | ORAL | Status: DC
Start: 2015-08-21 — End: 2015-12-16

## 2015-08-21 NOTE — Patient Instructions (Signed)
Please visit the lab for your thyroid repeat and we will contact you about your referral to podiatry.

## 2015-08-21 NOTE — Progress Notes (Signed)
Pre-visit discussion using our clinic review tool. No additional management support is needed unless otherwise documented below in the visit note.  

## 2015-08-21 NOTE — Assessment & Plan Note (Signed)
Adipex given for 2 more months.  Emphasized healthy diet and exercise

## 2015-08-21 NOTE — Assessment & Plan Note (Signed)
Bilateral Wearing sandels today  Wants to see podiatry

## 2015-08-21 NOTE — Progress Notes (Signed)
Patient ID: Karen Bolton, female    DOB: November 02, 1980  Age: 35 y.o. MRN: [id number]  CC: Follow-up   HPI Karen Bolton presents for follow up weight loss medication and thyroid labs.  1) Diet- No formal  Exercise- No formal  Lost 2 lbs in last month  Pt denies any side effects.   2) Thyroid- TSH is elevated  T4 free- normal  Sister- half has thyroid cancer  Sister- medication for thyroid   3) Bilateral foot pain especially after returning from the beach this past week. Reports swelling and pain in heels   History Karen Bolton has a past medical history of Insomnia; History of miscarriage; Anxiety; Depression; and Morbid obesity (HCC).   She has past surgical history that includes Oophorectomy (2010); Cholecystectomy; Wisdom tooth extraction (N/A, 2001); Abdominal hysterectomy; and Appendectomy.   Her family history includes Alcohol abuse in her father; Cancer in her cousin, maternal uncle, and paternal grandmother; Cancer (age of onset: 1835) in her maternal aunt; Cancer (age of onset: 9146) in her mother; Diabetes in her father; Heart disease in her father; Sleep apnea in her father.She reports that she has never smoked. She has never used smokeless tobacco. She reports that she drinks alcohol. She reports that she does not use illicit drugs.  Outpatient Prescriptions Prior to Visit  Medication Sig Dispense Refill  . ALPRAZolam (XANAX) 0.5 MG tablet     . QUEtiapine (SEROQUEL) 200 MG tablet Take 200 mg by mouth at bedtime.  5  . sertraline (ZOLOFT) 50 MG tablet Take 50 mg by mouth. Take 25mg  daily    . phentermine (ADIPEX-P) 37.5 MG tablet Take 1 tablet (37.5 mg total) by mouth daily before breakfast. 30 tablet 0   No facility-administered medications prior to visit.    ROS Review of Systems  Constitutional: Negative for fever, chills, diaphoresis, activity change, appetite change, fatigue and unexpected weight change.  Eyes: Negative for visual disturbance.  Respiratory: Negative  for chest tightness and shortness of breath.   Cardiovascular: Negative for chest pain.  Gastrointestinal: Negative for nausea, vomiting and diarrhea.  Neurological: Negative for headaches.  Psychiatric/Behavioral: Negative for suicidal ideas and sleep disturbance. The patient is not nervous/anxious.     Objective:  BP 118/72 mmHg  Pulse 90  Temp(Src) 98.2 F (36.8 C) (Oral)  Resp 16  Ht 5' 0.75" (1.543 m)  Wt 248 lb (112.492 kg)  BMI 47.25 kg/m2  SpO2 96%  LMP 07/14/2015  Physical Exam  Constitutional: She is oriented to person, place, and time. She appears well-developed and well-nourished. No distress.  HENT:  Head: Normocephalic and atraumatic.  Right Ear: External ear normal.  Left Ear: External ear normal.  Mouth/Throat: Oropharynx is clear and moist.  Neck: Normal range of motion. Neck supple. No thyromegaly present.  Cardiovascular: Normal rate, regular rhythm and normal heart sounds.   Pulmonary/Chest: Effort normal and breath sounds normal. No respiratory distress. She has no wheezes. She has no rales. She exhibits no tenderness.  Abdominal:  Obese  Musculoskeletal: Normal range of motion. She exhibits tenderness. She exhibits no edema.  Pain in heels bilaterally  Lymphadenopathy:    She has no cervical adenopathy.  Neurological: She is alert and oriented to person, place, and time.  Skin: Skin is warm and dry. No rash noted. She is not diaphoretic.  Psychiatric: She has a normal mood and affect. Her behavior is normal. Judgment and thought content normal.   Assessment & Plan:   Karen Bolton was seen today for follow-up.  Diagnoses and all orders for this visit:  Elevated TSH  Plantar fasciitis, bilateral -     Ambulatory referral to Podiatry  Subclinical hypothyroidism -     TSH -     T4, free  Obese  Other orders -     phentermine (ADIPEX-P) 37.5 MG tablet; Take 1 tablet (37.5 mg total) by mouth daily before breakfast.   I am having Ms. Vanderheiden  maintain her ALPRAZolam, QUEtiapine, sertraline, and phentermine.  Meds ordered this encounter  Medications  . phentermine (ADIPEX-P) 37.5 MG tablet    Sig: Take 1 tablet (37.5 mg total) by mouth daily before breakfast.    Dispense:  30 tablet    Refill:  1    Order Specific Question:  Supervising Provider    Answer:  Sherlene Shams [[id number]]     Follow-up: Return if symptoms worsen or fail to improve.

## 2015-08-21 NOTE — Assessment & Plan Note (Signed)
Checking tsh and t4 free today

## 2015-08-22 ENCOUNTER — Other Ambulatory Visit: Payer: Self-pay | Admitting: Nurse Practitioner

## 2015-08-22 DIAGNOSIS — R7989 Other specified abnormal findings of blood chemistry: Secondary | ICD-10-CM

## 2015-08-22 LAB — TSH: TSH: 7.14 u[IU]/mL — AB (ref 0.35–4.50)

## 2015-08-22 LAB — T4, FREE: Free T4: 0.66 ng/dL (ref 0.60–1.60)

## 2015-09-03 ENCOUNTER — Ambulatory Visit: Payer: BLUE CROSS/BLUE SHIELD | Admitting: Nurse Practitioner

## 2015-09-18 ENCOUNTER — Ambulatory Visit: Payer: BLUE CROSS/BLUE SHIELD | Admitting: Endocrinology

## 2015-10-09 ENCOUNTER — Encounter: Payer: Self-pay | Admitting: Family Medicine

## 2015-10-09 ENCOUNTER — Ambulatory Visit (INDEPENDENT_AMBULATORY_CARE_PROVIDER_SITE_OTHER): Payer: BLUE CROSS/BLUE SHIELD | Admitting: Family Medicine

## 2015-10-09 ENCOUNTER — Ambulatory Visit: Payer: BLUE CROSS/BLUE SHIELD | Admitting: Family Medicine

## 2015-10-09 VITALS — BP 126/84 | HR 89 | Temp 97.8°F | Ht 60.75 in | Wt 250.5 lb

## 2015-10-09 DIAGNOSIS — M25512 Pain in left shoulder: Secondary | ICD-10-CM | POA: Diagnosis not present

## 2015-10-09 DIAGNOSIS — M25511 Pain in right shoulder: Secondary | ICD-10-CM | POA: Diagnosis not present

## 2015-10-09 DIAGNOSIS — M25519 Pain in unspecified shoulder: Secondary | ICD-10-CM | POA: Insufficient documentation

## 2015-10-09 MED ORDER — DICLOFENAC SODIUM 75 MG PO TBEC: 75.0000 mg | DELAYED_RELEASE_TABLET | Freq: Two times a day (BID) | ORAL | Status: DC | PRN

## 2015-10-09 MED ORDER — CYCLOBENZAPRINE HCL 10 MG PO TABS: 10.0000 mg | ORAL_TABLET | Freq: Three times a day (TID) | ORAL | Status: DC | PRN

## 2015-10-09 NOTE — Assessment & Plan Note (Signed)
New problem. Shoulder exam unremarkable. Patient with significant tenderness and tightness of the trapezius muscle. Likely MSK in origin. Treating with Flexeril and diclofenac. If fails treatment, will consider imaging the neck.

## 2015-10-09 NOTE — Progress Notes (Signed)
Subjective:  Patient ID: Karen Bolton, female    DOB: 1980/04/15  Age: 35 y.o. MRN: [id number]  CC: Shoulder pain  HPI:  35 year old female presents with the above complaint.  Patient reports a three-day history of bilateral shoulder pain, left greater than right. No recent fall, trauma, injury. She reports that the pain worsened today particularly of the left trapezius region and shoulder. She reports that the pain is stabbing in character. No associated numbness or tingling. No known exacerbating or relieving factors. No medications or interventions tried. No other complaints today.  Social Hx   Social History   Social History  . Marital Status: Married    Spouse Name: N/A  . Number of Children: N/A  . Years of Education: N/A   Social History Main Topics  . Smoking status: Never Smoker   . Smokeless tobacco: Never Used  . Alcohol Use: 0.0 oz/week    0 Standard drinks or equivalent per week     Comment: Very rare  . Drug Use: No  . Sexual Activity:    Partners: Male    Birth Control/ Protection: None   Other Topics Concern  . None   Social History Narrative   Homemaker    Lives with husband, 2 stepchildren, and mother-in-law    Pets- 1 dog inside   Caffeine- No coffee, 2 20 oz bottles soda, no tea, rare chocolate    Enjoys taking kids to the park        Review of Systems  Constitutional: Negative.   Musculoskeletal:       Shoulder pain.   Objective:  BP 126/84 mmHg  Pulse 89  Temp(Src) 97.8 F (36.6 C) (Oral)  Ht 5' 0.75" (1.543 m)  Wt 250 lb 8 oz (113.626 kg)  BMI 47.72 kg/m2  SpO2 96%  BP/Weight 10/09/2015 08/21/2015 07/30/2015  Systolic BP 126 118 112  Diastolic BP 84 72 70  Wt. (Lbs) 250.5 248 250.2  BMI 47.72 47.25 47.67   Physical Exam  Constitutional: She is oriented to person, place, and time. She appears well-developed. No distress.  Cardiovascular: Normal rate and regular rhythm.   Pulmonary/Chest: Effort normal and breath sounds normal.  She has no wheezes. She has no rales.  Musculoskeletal:  Shoulder: Bilateral Inspection reveals no abnormalities, atrophy or asymmetry. Palpation is normal with no tenderness over AC joint or bicipital groove. ROM is full in all planes. Rotator cuff strength normal throughout. No signs of impingement with negative Hawkin's tests, empty can. Patient with tenderness over trapezius muscle, L>R.    Neurological: She is alert and oriented to person, place, and time.  Psychiatric: She has a normal mood and affect.  Vitals reviewed.  Lab Results  Component Value Date   WBC 10.7 05/20/2014   HGB 12.7 05/20/2014   HCT 38.0 05/20/2014   PLT 292 05/20/2014   GLUCOSE 93 06/12/2015   CHOL 146 06/12/2015   TRIG 151.0* 06/12/2015   HDL 38.40* 06/12/2015   LDLCALC 77 06/12/2015   ALT 23 06/12/2015   AST 25 06/12/2015   NA 136 06/12/2015   K 4.0 06/12/2015   CL 104 06/12/2015   CREATININE 0.78 06/12/2015   BUN 14 06/12/2015   CO2 23 06/12/2015   TSH 7.14* 08/21/2015   HGBA1C 5.6 07/30/2015   Assessment & Plan:   Problem List Items Addressed This Visit    Shoulder pain - Primary    New problem. Shoulder exam unremarkable. Patient with significant tenderness and tightness of the trapezius  muscle. Likely MSK in origin. Treating with Flexeril and diclofenac. If fails treatment, will consider imaging the neck.         Meds ordered this encounter  Medications  . diclofenac (VOLTAREN) 75 MG EC tablet    Sig: Take 1 tablet (75 mg total) by mouth 2 (two) times daily as needed.    Dispense:  30 tablet    Refill:  0  . cyclobenzaprine (FLEXERIL) 10 MG tablet    Sig: Take 1 tablet (10 mg total) by mouth 3 (three) times daily as needed for muscle spasms.    Dispense:  30 tablet    Refill:  0   Follow-up: PRN  Everlene OtherJayce Elanna Bert DO [facility] [place]

## 2015-10-09 NOTE — Patient Instructions (Signed)
Take the medications as prescribed.  Follow up if it fails to improve or worsens.  Take care  Dr. Adriana Simasook

## 2015-10-17 ENCOUNTER — Ambulatory Visit (INDEPENDENT_AMBULATORY_CARE_PROVIDER_SITE_OTHER): Payer: BLUE CROSS/BLUE SHIELD | Admitting: Family Medicine

## 2015-10-17 ENCOUNTER — Telehealth: Payer: Self-pay | Admitting: Family Medicine

## 2015-10-17 ENCOUNTER — Ambulatory Visit (INDEPENDENT_AMBULATORY_CARE_PROVIDER_SITE_OTHER)
Admission: RE | Admit: 2015-10-17 | Discharge: 2015-10-17 | Disposition: A | Discharge status: Home / Self Care | Payer: BLUE CROSS/BLUE SHIELD | Source: Ambulatory Visit | Attending: Family Medicine | Admitting: Family Medicine

## 2015-10-17 ENCOUNTER — Encounter: Payer: Self-pay | Admitting: Family Medicine

## 2015-10-17 ENCOUNTER — Ambulatory Visit: Payer: BLUE CROSS/BLUE SHIELD

## 2015-10-17 VITALS — BP 118/82 | HR 92 | Temp 98.2°F | Ht 60.75 in | Wt 254.2 lb

## 2015-10-17 DIAGNOSIS — M545 Low back pain, unspecified: Secondary | ICD-10-CM | POA: Insufficient documentation

## 2015-10-17 DIAGNOSIS — M5489 Other dorsalgia: Secondary | ICD-10-CM

## 2015-10-17 LAB — POCT URINALYSIS DIPSTICK
Bilirubin, UA: NEGATIVE
Glucose, UA: NEGATIVE
Ketones, UA: NEGATIVE
LEUKOCYTES UA: NEGATIVE
NITRITE UA: NEGATIVE
PH UA: 6
PROTEIN UA: NEGATIVE
Spec Grav, UA: >=1.03
Urobilinogen, UA: 0.2

## 2015-10-17 LAB — URINALYSIS, MICROSCOPIC ONLY

## 2015-10-17 MED ORDER — METHOCARBAMOL 500 MG PO TABS: 1000.0000 mg | ORAL_TABLET | Freq: Four times a day (QID) | ORAL | Status: DC | PRN

## 2015-10-17 MED ORDER — TAMSULOSIN HCL 0.4 MG PO CAPS: 0.4000 mg | ORAL_CAPSULE | Freq: Every day | ORAL | Status: DC

## 2015-10-17 MED ORDER — PREDNISONE 10 MG PO TABS: ORAL_TABLET | ORAL | Status: DC

## 2015-10-17 NOTE — Assessment & Plan Note (Addendum)
Patient with acute left low back pain. Suspect muscular cause given area of tenderness and worsened by movement. No red flags. Neurologically intact in her lower extremities. Doubt kidney stone given history and lack of urinary symptoms though UA with blood noted. Vital signs are stable. We will treat her with a prednisone taper and Robaxin. She will discontinue Flexeril and diclofenac. Given UA with blood we will send urine for microscopy and culture. Given history of kidney stone and possible microscopic blood in her urine we will obtain a KUB to evaluate for kidney stone. Patient declined pregnancy test prior to imaging. If microscopy is negative and KUB does not reveal a stone would favor treatment for musculoskeletal cause. Given exam and history still believe this is most likely musculoskeletal in nature. She is given return precautions.

## 2015-10-17 NOTE — Telephone Encounter (Signed)
Spoke with patient regarding results. X-ray could not rule out distal ureter stone. Urine does have blood in it. Symptoms could be related to muscular strain versus kidney stone. Urine microscopy with blood argues for kidney stone. We'll start on Flomax and continue other treatment for muscular cause. Advised to return precautions.

## 2015-10-17 NOTE — Progress Notes (Signed)
Patient ID: Karen Bolton, female   DOB: 1981/03/15, [age] y.o.   MRN: [id number]  Karen AlarEric Rickelle Sylvestre, MD Phone: [phone]  Karen Lewandowskyamela Bolton is a 35 y.o. female who presents today for same-day visit.  Patient notes onset of left low back pain yesterday. Notes her dog tripped her the day before. Notes it radiates up to her shoulder on the right side. Notes it is a sharp stabbing pain intermittently. Significantly worsened with movement. Not worsened by anything else. No numbness, weakness, loss of bowel or bladder function, saddle anesthesia, fevers, chest pain, shortness of breath, urinary frequency, urinary urgency, dysuria, or fevers. She's tried Flexeril and diclofenac with no benefit. Does report a history of kidney stones. No hematuria.  PMH: nonsmoker.   ROS see history of present illness  Objective  Physical Exam Filed Vitals:   10/17/15 1308  BP: 118/82  Pulse: 92  Temp: 98.2 F (36.8 C)    BP Readings from Last 3 Encounters:  10/17/15 118/82  10/09/15 126/84  08/21/15 118/72   Wt Readings from Last 3 Encounters:  10/17/15 254 lb 3.2 oz (115.304 kg)  10/09/15 250 lb 8 oz (113.626 kg)  08/21/15 248 lb (112.492 kg)    Physical Exam  Constitutional: No distress.  HENT:  Head: Normocephalic and atraumatic.  Right Ear: External ear normal.  Left Ear: External ear normal.  Cardiovascular: Normal rate, regular rhythm and normal heart sounds.   Pulmonary/Chest: Effort normal and breath sounds normal.  Musculoskeletal: She exhibits no edema.  No midline spine tenderness, no midline spine step-off, there is muscular tenderness in the left lumbar area, no overlying skin changes, no CVA tenderness bilaterally  Neurological: She is alert.  5 out of 5 strength bilateral quads, hamstrings, plantar flexion, and dorsiflexion, sensation to light touch intact in bilateral lower extremities, absent patellar reflexes bilaterally, gait is slow and deliberate  Skin: Skin is warm and dry.  She is not diaphoretic.     Assessment/Plan: Please see individual problem list.  Acute low back pain Patient with acute left low back pain. Suspect muscular cause given area of tenderness and worsened by movement. No red flags. Neurologically intact in her lower extremities. Doubt kidney stone given history and lack of urinary symptoms. Vital signs are stable. We will check a UA. We will treat her with a prednisone taper and Robaxin. She will discontinue Flexeril and diclofenac. She is given return precautions.    Orders Placed This Encounter  Procedures  . POCT Urinalysis Dipstick    Meds ordered this encounter  Medications  . methocarbamol (ROBAXIN) 500 MG tablet    Sig: Take 2 tablets (1,000 mg total) by mouth every 6 (six) hours as needed for muscle spasms.    Dispense:  20 tablet    Refill:  0  . predniSONE (DELTASONE) 10 MG tablet    Sig: Please take 60 mg (6 tablets) by mouth today, then decrease by 1 tablet daily by mouth until gone    Dispense:  21 tablet    Refill:  0    Karen AlarEric Con Arganbright, MD [facility]

## 2015-10-17 NOTE — Telephone Encounter (Signed)
Pt called to follow up on her xray results.   Call pt @ [phone]. Thank you!

## 2015-10-17 NOTE — Patient Instructions (Signed)
Nice to see you. The discomfort in your left low back is likely related to muscular strain. We will treat you with prednisone and Robaxin. You should stop the diclofenac and Flexeril. If you develop numbness, weakness, loss of bowel or bladder function, numbness of your legs, blood in your urine, worsening pain, or any new or changing symptoms please seek medical attention.

## 2015-10-17 NOTE — Progress Notes (Signed)
Pre visit review using our clinic review tool, if applicable. No additional management support is needed unless otherwise documented below in the visit note. 

## 2015-10-19 LAB — URINE CULTURE: Colony Count: >=100000

## 2015-10-21 ENCOUNTER — Encounter: Payer: Self-pay | Admitting: Family Medicine

## 2015-10-21 ENCOUNTER — Ambulatory Visit
Admission: RE | Admit: 2015-10-21 | Discharge: 2015-10-21 | Disposition: A | Discharge status: Home / Self Care | Payer: BLUE CROSS/BLUE SHIELD | Source: Ambulatory Visit | Attending: Family Medicine | Admitting: Family Medicine

## 2015-10-21 ENCOUNTER — Ambulatory Visit (INDEPENDENT_AMBULATORY_CARE_PROVIDER_SITE_OTHER): Payer: BLUE CROSS/BLUE SHIELD | Admitting: Family Medicine

## 2015-10-21 VITALS — BP 132/84 | HR 87 | Temp 98.6°F | Ht 60.75 in | Wt 252.6 lb

## 2015-10-21 DIAGNOSIS — R911 Solitary pulmonary nodule: Secondary | ICD-10-CM | POA: Insufficient documentation

## 2015-10-21 DIAGNOSIS — M5489 Other dorsalgia: Secondary | ICD-10-CM

## 2015-10-21 DIAGNOSIS — R3129 Other microscopic hematuria: Secondary | ICD-10-CM

## 2015-10-21 DIAGNOSIS — Z9049 Acquired absence of other specified parts of digestive tract: Secondary | ICD-10-CM | POA: Diagnosis not present

## 2015-10-21 DIAGNOSIS — M545 Low back pain, unspecified: Secondary | ICD-10-CM

## 2015-10-21 DIAGNOSIS — N83202 Unspecified ovarian cyst, left side: Secondary | ICD-10-CM | POA: Insufficient documentation

## 2015-10-21 LAB — POCT URINALYSIS DIPSTICK
Bilirubin, UA: NEGATIVE
Glucose, UA: NEGATIVE
KETONES UA: NEGATIVE
LEUKOCYTES UA: NEGATIVE
NITRITE UA: NEGATIVE
PH UA: 7
PROTEIN UA: NEGATIVE
Spec Grav, UA: 1.02
UROBILINOGEN UA: 0.2

## 2015-10-21 LAB — POCT URINE PREGNANCY: Preg Test, Ur: NEGATIVE

## 2015-10-21 LAB — URINALYSIS, MICROSCOPIC ONLY

## 2015-10-21 MED ORDER — OXYCODONE-ACETAMINOPHEN 5-325 MG PO TABS: 1.0000 | ORAL_TABLET | Freq: Three times a day (TID) | ORAL | Status: DC | PRN

## 2015-10-21 NOTE — Progress Notes (Signed)
Patient ID: Karen Bolton, female   DOB: May 30, 1980, 35 y.o.   MRN: [id number]  Marikay AlarEric Dalbert Stillings, MD Phone: [phone]  Karen Bolton is a 35 y.o. female who presents today for follow-up.  Patient was seen last week for left low back pain. She notes continued back pain. Now bilateral lower back pain. Now has discomfort on urination in back. No urinary frequency or urgency. No hematuria. No numbness or weakness in her legs. No loss of bowel or bladder function, saddle anesthesia, or fevers. She does note it hurts to bend over. Notes this feels like her prior kidney stones. She's been taking Flomax and trying to drink fluids as best as possible. No vaginal discharge. No vaginal bleeding. LMP was 2 weeks ago.   ROS see history of present illness  Objective  Physical Exam Filed Vitals:   10/21/15 1118  BP: 132/84  Pulse: 87  Temp: 98.6 F (37 C)    BP Readings from Last 3 Encounters:  10/21/15 132/84  10/17/15 118/82  10/09/15 126/84   Wt Readings from Last 3 Encounters:  10/21/15 252 lb 9.6 oz (114.579 kg)  10/17/15 254 lb 3.2 oz (115.304 kg)  10/09/15 250 lb 8 oz (113.626 kg)    Physical Exam  Constitutional: She is well-developed, well-nourished, and in no distress.  HENT:  Head: Normocephalic and atraumatic.  Cardiovascular: Normal rate, regular rhythm and normal heart sounds.   Pulmonary/Chest: Effort normal and breath sounds normal.  Abdominal: Soft. Bowel sounds are normal. She exhibits no distension. There is no tenderness. There is no rebound and no guarding.  Neurological: She is alert. Gait normal.  5/5 strength bilateral quads, hamstrings, plantar and dorsiflexion, sensation to light touch intact bilateral LE, 2+ patellar reflexes  Skin: Skin is warm and dry. She is not diaphoretic.     Assessment/Plan: Please see individual problem list.  Acute low back pain Patient with continued low back pain now bilateral radiating around to her pelvis. Notes some  discomfort on urination. Could be consistent with nephrolithiasis given blood in her urine and history of kidney stones. Has not improved with use of Flomax. Given persistent pain and slightly worsened pain with blood in her urine and history of kidney stones we will obtain a CT scan of her abdomen and pelvis without contrast to rule out kidney stone. We will send her urine for microscopy and culture as well. Urine pregnancy test was negative. We will treat pain with Percocet. Continue Flomax. Given return precautions.    Orders Placed This Encounter  Procedures  . Urine Culture  . CT Abdomen Pelvis Wo Contrast    Standing Status: Future     Number of Occurrences:      Standing Expiration Date: 01/20/2017    Order Specific Question:  Reason for Exam (SYMPTOM  OR DIAGNOSIS REQUIRED)    Answer:  Bilateral low back pain, microscopic hematuria, history of kidney stones    Order Specific Question:  Is the patient pregnant?    Answer:  No    Order Specific Question:  Preferred imaging location?    Answer:  [facility]    Order Specific Question:  Call Results- Best Contact Number?    Answer:  [phone]:  [phone]  . Urine Microscopic Only  . POCT Urinalysis Dipstick  . POCT urine pregnancy    Meds ordered this encounter  Medications  . oxyCODONE-acetaminophen (ROXICET) 5-325 MG tablet    Sig: Take 1 tablet by mouth every 8 (eight) hours as needed for severe pain.  Dispense:  15 tablet    Refill:  0    Marikay Alar, MD [facility]

## 2015-10-21 NOTE — Progress Notes (Signed)
Pre visit review using our clinic review tool, if applicable. No additional management support is needed unless otherwise documented below in the visit note. 

## 2015-10-21 NOTE — Patient Instructions (Signed)
Nice to see you. We will obtain CT scan to evaluate for kidney stone. We will treat your pain with Percocet. If you develop fevers, abdominal pain, worsening back pain, numbness, weakness, loss of bowel or bladder function, numbness of her legs, or any new or changing symptoms please seek medical attention.

## 2015-10-21 NOTE — Assessment & Plan Note (Addendum)
Patient with continued low back pain now bilateral radiating around to her pelvis. Notes some discomfort on urination. Could be consistent with nephrolithiasis given blood in her urine and history of kidney stones. Has not improved with use of Flomax. Given persistent pain and slightly worsened pain with blood in her urine and history of kidney stones we will obtain a CT scan of her abdomen and pelvis without contrast to rule out kidney stone. We will send her urine for microscopy and culture as well. Urine pregnancy test was negative. We will treat pain with Percocet. Continue Flomax. Given return precautions.

## 2015-10-22 ENCOUNTER — Ambulatory Visit: Payer: BLUE CROSS/BLUE SHIELD | Admitting: Endocrinology

## 2015-10-23 LAB — URINE CULTURE
COLONY COUNT: NO GROWTH
ORGANISM ID, BACTERIA: NO GROWTH

## 2015-10-27 ENCOUNTER — Ambulatory Visit: Payer: BLUE CROSS/BLUE SHIELD | Admitting: Obstetrics & Gynecology

## 2015-10-28 ENCOUNTER — Ambulatory Visit: Payer: BLUE CROSS/BLUE SHIELD | Admitting: Obstetrics & Gynecology

## 2015-10-28 ENCOUNTER — Ambulatory Visit: Payer: Self-pay | Admitting: Podiatry

## 2015-11-03 ENCOUNTER — Ambulatory Visit: Payer: BLUE CROSS/BLUE SHIELD | Admitting: Obstetrics & Gynecology

## 2015-11-11 ENCOUNTER — Ambulatory Visit: Payer: Self-pay | Admitting: Podiatry

## 2015-11-17 ENCOUNTER — Ambulatory Visit: Payer: BLUE CROSS/BLUE SHIELD | Admitting: Endocrinology

## 2015-11-25 ENCOUNTER — Ambulatory Visit: Payer: BLUE CROSS/BLUE SHIELD | Admitting: Obstetrics & Gynecology

## 2015-12-02 ENCOUNTER — Ambulatory Visit: Payer: Self-pay | Admitting: Podiatry

## 2015-12-08 ENCOUNTER — Ambulatory Visit: Payer: BLUE CROSS/BLUE SHIELD | Admitting: Obstetrics & Gynecology

## 2015-12-08 DIAGNOSIS — Z113 Encounter for screening for infections with a predominantly sexual mode of transmission: Secondary | ICD-10-CM

## 2015-12-09 ENCOUNTER — Ambulatory Visit: Payer: Self-pay | Admitting: Podiatry

## 2015-12-09 ENCOUNTER — Ambulatory Visit: Payer: BLUE CROSS/BLUE SHIELD | Admitting: Family

## 2015-12-10 ENCOUNTER — Ambulatory Visit: Payer: BLUE CROSS/BLUE SHIELD | Admitting: Family Medicine

## 2015-12-11 ENCOUNTER — Ambulatory Visit: Payer: BLUE CROSS/BLUE SHIELD | Admitting: Family Medicine

## 2015-12-11 DIAGNOSIS — Z0289 Encounter for other administrative examinations: Secondary | ICD-10-CM

## 2015-12-16 ENCOUNTER — Ambulatory Visit (INDEPENDENT_AMBULATORY_CARE_PROVIDER_SITE_OTHER): Payer: BLUE CROSS/BLUE SHIELD | Admitting: Family Medicine

## 2015-12-16 ENCOUNTER — Encounter: Payer: Self-pay | Admitting: Family Medicine

## 2015-12-16 VITALS — BP 134/84 | HR 102 | Temp 98.3°F | Wt 256.4 lb

## 2015-12-16 DIAGNOSIS — M899 Disorder of bone, unspecified: Secondary | ICD-10-CM | POA: Diagnosis not present

## 2015-12-16 DIAGNOSIS — J019 Acute sinusitis, unspecified: Secondary | ICD-10-CM

## 2015-12-16 MED ORDER — DICLOFENAC SODIUM 75 MG PO TBEC: 75.0000 mg | DELAYED_RELEASE_TABLET | Freq: Two times a day (BID) | ORAL | 0 refills | Status: DC

## 2015-12-16 MED ORDER — DOXYCYCLINE HYCLATE 100 MG PO TABS: 100.0000 mg | ORAL_TABLET | Freq: Two times a day (BID) | ORAL | 0 refills | Status: DC

## 2015-12-16 NOTE — Patient Instructions (Signed)
Take the antibiotic twice daily with food.  Take the diclofenac twice daily as needed.  Take care  Dr. Adriana Simasook

## 2015-12-16 NOTE — Progress Notes (Signed)
Pre visit review using our clinic review tool, if applicable. No additional management support is needed unless otherwise documented below in the visit note. 

## 2015-12-17 DIAGNOSIS — J019 Acute sinusitis, unspecified: Secondary | ICD-10-CM | POA: Insufficient documentation

## 2015-12-17 DIAGNOSIS — M899 Disorder of bone, unspecified: Secondary | ICD-10-CM | POA: Insufficient documentation

## 2015-12-17 NOTE — Assessment & Plan Note (Signed)
New problem.  Treating with diclofenac

## 2015-12-17 NOTE — Progress Notes (Signed)
Subjective:  Patient ID: Karen Lewandowskyamela Bolton, female    DOB: 1980-12-15  Age: 35 y.o. MRN: [id number]  CC: URI symptoms, pelvic pain  HPI:  35 year old female presents with the above complaints.  Patient states that she's had sinus pressure, pain, and headache for the past 2 weeks. She reports associated sinus congestion. She's used over-the-counter medications without relief. No associated fevers or chills. No known exacerbating factors. No other respiratory symptoms.  Additionally, patient states that on Saturday she developed pain on the right side of her pubic symphysis. Unknown etiology. No fall, trauma, injury. Worse with ambulation and activity. No relieving factors. No other associated symptoms. No other complaints at this time.  Social Hx   Social History   Social History  . Marital status: Married    Spouse name: N/A  . Number of children: N/A  . Years of education: N/A   Social History Main Topics  . Smoking status: Never Smoker  . Smokeless tobacco: Never Used  . Alcohol use 0.0 oz/week     Comment: Very rare  . Drug use: No  . Sexual activity: Yes    Partners: Male    Birth control/ protection: None   Other Topics Concern  . None   Social History Narrative   Homemaker    Lives with husband, 2 stepchildren, and mother-in-law    Pets- 1 dog inside   Caffeine- No coffee, 2 20 oz bottles soda, no tea, rare chocolate    Enjoys taking kids to the park        Review of Systems  Constitutional: Negative for fever.  HENT: Positive for congestion and sinus pressure.   Musculoskeletal:       Pain at pubic bone.  Neurological: Positive for headaches.   Objective:  BP 134/84 (BP Location: Left Arm, Patient Position: Sitting, Cuff Size: Large)   Pulse (!) 102   Temp 98.3 F (36.8 C) (Oral)   Wt 256 lb 6 oz (116.3 kg)   SpO2 96%   BMI 48.84 kg/m   BP/Weight 12/16/2015 10/21/2015 10/17/2015  Systolic BP 134 132 118  Diastolic BP 84 84 82  Wt. (Lbs) 256.38 252.6  254.2  BMI 48.84 48.13 48.43   Physical Exam  Constitutional: She appears well-developed. No distress.  HENT:  Head: Normocephalic and atraumatic.  Mouth/Throat: Oropharynx is clear and moist.  Maxillary sinus tenderness to palpation.  Eyes: Conjunctivae are normal.  Cardiovascular: Normal rate and regular rhythm.   Pulmonary/Chest: Effort normal. She has no wheezes. She has no rales.  Musculoskeletal:  Chest patient just to the right of the pubic symphysis.  Vitals reviewed.  Lab Results  Component Value Date   WBC 10.7 05/20/2014   HGB 12.7 05/20/2014   HCT 38.0 05/20/2014   PLT 292 05/20/2014   GLUCOSE 93 06/12/2015   CHOL 146 06/12/2015   TRIG 151.0 (H) 06/12/2015   HDL 38.40 (L) 06/12/2015   LDLCALC 77 06/12/2015   ALT 23 06/12/2015   AST 25 06/12/2015   NA 136 06/12/2015   K 4.0 06/12/2015   CL 104 06/12/2015   CREATININE 0.78 06/12/2015   BUN 14 06/12/2015   CO2 23 06/12/2015   TSH 7.14 (H) 08/21/2015   HGBA1C 5.6 07/30/2015    Assessment & Plan:   Problem List Items Addressed This Visit    Acute sinusitis - Primary    New acute illness.  Treating empirically with doxycycline.      Relevant Medications   doxycycline (VIBRA-TABS) 100 MG  tablet   Pubic bone pain    New problem.  Treating with diclofenac       Other Visit Diagnoses   None.     Meds ordered this encounter  Medications  . doxycycline (VIBRA-TABS) 100 MG tablet    Sig: Take 1 tablet (100 mg total) by mouth 2 (two) times daily.    Dispense:  20 tablet    Refill:  0  . diclofenac (VOLTAREN) 75 MG EC tablet    Sig: Take 1 tablet (75 mg total) by mouth 2 (two) times daily.    Dispense:  30 tablet    Refill:  0    Follow-up: Return if symptoms worsen or fail to improve.  Everlene Other DO [facility]

## 2015-12-17 NOTE — Assessment & Plan Note (Signed)
New acute illness.  Treating empirically with doxycycline.

## 2015-12-18 ENCOUNTER — Ambulatory Visit: Payer: BLUE CROSS/BLUE SHIELD | Admitting: Endocrinology

## 2015-12-23 ENCOUNTER — Encounter: Payer: BLUE CROSS/BLUE SHIELD | Admitting: Podiatry

## 2016-01-07 ENCOUNTER — Ambulatory Visit: Payer: BLUE CROSS/BLUE SHIELD | Admitting: Endocrinology

## 2016-01-13 ENCOUNTER — Encounter: Payer: Self-pay | Admitting: Family Medicine

## 2016-01-13 ENCOUNTER — Ambulatory Visit (INDEPENDENT_AMBULATORY_CARE_PROVIDER_SITE_OTHER): Payer: BLUE CROSS/BLUE SHIELD | Admitting: Family Medicine

## 2016-01-13 DIAGNOSIS — J209 Acute bronchitis, unspecified: Secondary | ICD-10-CM | POA: Diagnosis not present

## 2016-01-13 MED ORDER — PREDNISONE 50 MG PO TABS: ORAL_TABLET | ORAL | 0 refills | Status: DC

## 2016-01-13 MED ORDER — HYDROCOD POLST-CPM POLST ER 10-8 MG/5ML PO SUER: 5.0000 mL | Freq: Two times a day (BID) | ORAL | 0 refills | Status: DC | PRN

## 2016-01-13 NOTE — Progress Notes (Signed)
Subjective:  Patient ID: Karen Bolton, female    DOB: [birth year]  Age: 35 y.o. MRN: [id number]  CC: Cough  HPI:  35 year old female presents with the above complaint.  Patient states that she's been sick for the past 1.5 weeks. She has been experiencing hoarseness, postnasal drip, and severe cough. She reports associated lower rib discomfort from the cough. She's been taking Delsym and Robitussin without improvement. She also reports fatigue. No fever or chills. No known exacerbating factors. No other complaints at this time.  Of note, she has had a recent sick contact in her mother.  Social Hx   Social History   Social History  . Marital status: Married    Spouse name: N/A  . Number of children: N/A  . Years of education: N/A   Social History Main Topics  . Smoking status: Never Smoker  . Smokeless tobacco: Never Used  . Alcohol use 0.0 oz/week     Comment: Very rare  . Drug use: No  . Sexual activity: Yes    Partners: Male    Birth control/ protection: None   Other Topics Concern  . None   Social History Narrative   Homemaker    Lives with husband, 2 stepchildren, and mother-in-law    Pets- 1 dog inside   Caffeine- No coffee, 2 20 oz bottles soda, no tea, rare chocolate    Enjoys taking kids to the park        Review of Systems  Constitutional: Positive for fatigue. Negative for fever.  HENT: Positive for postnasal drip and voice change.   Respiratory: Positive for cough.    Objective:  BP 122/86 (BP Location: Right Arm, Patient Position: Sitting, Cuff Size: Large)   Pulse (!) 102   Temp 98.3 F (36.8 C) (Oral)   Wt 260 lb (117.9 kg)   SpO2 96%   BMI 49.53 kg/m   BP/Weight 01/13/2016 12/16/2015 10/21/2015  Systolic BP 122 134 132  Diastolic BP 86 84 84  Wt. (Lbs) 260 256.38 252.6  BMI 49.53 48.84 48.13   Physical Exam  Constitutional: She is oriented to person, place, and time. She appears well-developed. No distress.  HENT:  Head: Normocephalic  and atraumatic.  Mouth/Throat: Oropharynx is clear and moist.  Normal TM's bilaterally.   Eyes: Conjunctivae are normal.  Cardiovascular: Regular rhythm.  Tachycardia present.   Pulmonary/Chest: Effort normal. She has no wheezes. She has no rales.  Lymphadenopathy:    She has cervical adenopathy.  Neurological: She is alert and oriented to person, place, and time.  Psychiatric: She has a normal mood and affect.  Vitals reviewed.  Lab Results  Component Value Date   WBC 10.7 05/20/2014   HGB 12.7 05/20/2014   HCT 38.0 05/20/2014   PLT 292 05/20/2014   GLUCOSE 93 06/12/2015   CHOL 146 06/12/2015   TRIG 151.0 (H) 06/12/2015   HDL 38.40 (L) 06/12/2015   LDLCALC 77 06/12/2015   ALT 23 06/12/2015   AST 25 06/12/2015   NA 136 06/12/2015   K 4.0 06/12/2015   CL 104 06/12/2015   CREATININE 0.78 06/12/2015   BUN 14 06/12/2015   CO2 23 06/12/2015   TSH 7.14 (H) 08/21/2015   HGBA1C 5.6 07/30/2015   Assessment & Plan:   Problem List Items Addressed This Visit    Acute bronchitis    New acute problem. Treating with Prednisone and Tussionex.       Other Visit Diagnoses   None.    Meds  ordered this encounter  Medications  . predniSONE (DELTASONE) 50 MG tablet    Sig: 1 tablet daily x 5 days.    Dispense:  5 tablet    Refill:  0  . chlorpheniramine-HYDROcodone (TUSSIONEX PENNKINETIC ER) 10-8 MG/5ML SUER    Sig: Take 5 mLs by mouth every 12 (twelve) hours as needed.    Dispense:  115 mL    Refill:  0    Follow-up: PRN  Everlene Other DO [facility]

## 2016-01-13 NOTE — Assessment & Plan Note (Signed)
New acute problem. Treating with Prednisone and Tussionex. 

## 2016-01-13 NOTE — Patient Instructions (Signed)
This is likely viral (especially with the hoarseness) bronchitis.  Take the medications as prescribed.  Call or send a mychart message if you fail to improve or worsen.  Take care  Dr. Adriana Simasook    Acute Bronchitis Bronchitis is inflammation of the airways that extend from the windpipe into the lungs (bronchi). The inflammation often causes mucus to develop. This leads to a cough, which is the most common symptom of bronchitis.  In acute bronchitis, the condition usually develops suddenly and goes away over time, usually in a couple weeks. Smoking, allergies, and asthma can make bronchitis worse. Repeated episodes of bronchitis may cause further lung problems.  CAUSES Acute bronchitis is most often caused by the same virus that causes a cold. The virus can spread from person to person (contagious) through coughing, sneezing, and touching contaminated objects. SIGNS AND SYMPTOMS   Cough.   Fever.   Coughing up mucus.   Body aches.   Chest congestion.   Chills.   Shortness of breath.   Sore throat.  DIAGNOSIS  Acute bronchitis is usually diagnosed through a physical exam. Your health care provider will also ask you questions about your medical history. Tests, such as chest X-rays, are sometimes done to rule out other conditions.  TREATMENT  Acute bronchitis usually goes away in a couple weeks. Oftentimes, no medical treatment is necessary. Medicines are sometimes given for relief of fever or cough. Antibiotic medicines are usually not needed but may be prescribed in certain situations. In some cases, an inhaler may be recommended to help reduce shortness of breath and control the cough. A cool mist vaporizer may also be used to help thin bronchial secretions and make it easier to clear the chest.  HOME CARE INSTRUCTIONS  Get plenty of rest.   Drink enough fluids to keep your urine clear or pale yellow (unless you have a medical condition that requires fluid restriction).  Increasing fluids may help thin your respiratory secretions (sputum) and reduce chest congestion, and it will prevent dehydration.   Take medicines only as directed by your health care provider.  If you were prescribed an antibiotic medicine, finish it all even if you start to feel better.  Avoid smoking and secondhand smoke. Exposure to cigarette smoke or irritating chemicals will make bronchitis worse. If you are a smoker, consider using nicotine gum or skin patches to help control withdrawal symptoms. Quitting smoking will help your lungs heal faster.   Reduce the chances of another bout of acute bronchitis by washing your hands frequently, avoiding people with cold symptoms, and trying not to touch your hands to your mouth, nose, or eyes.   Keep all follow-up visits as directed by your health care provider.  SEEK MEDICAL CARE IF: Your symptoms do not improve after 1 week of treatment.  SEEK IMMEDIATE MEDICAL CARE IF:  You develop an increased fever or chills.   You have chest pain.   You have severe shortness of breath.  You have bloody sputum.   You develop dehydration.  You faint or repeatedly feel like you are going to pass out.  You develop repeated vomiting.  You develop a severe headache. MAKE SURE YOU:   Understand these instructions.  Will watch your condition.  Will get help right away if you are not doing well or get worse.   This information is not intended to replace advice given to you by your health care provider. Make sure you discuss any questions you have with your health care  provider.   Document Released: 05/06/2004 Document Revised: 04/19/2014 Document Reviewed: 09/19/2012 Elsevier Interactive Patient Education [employer].

## 2016-01-13 NOTE — Progress Notes (Signed)
Pre visit review using our clinic review tool, if applicable. No additional management support is needed unless otherwise documented below in the visit note. 

## 2016-02-03 ENCOUNTER — Ambulatory Visit: Payer: BLUE CROSS/BLUE SHIELD | Admitting: Endocrinology

## 2016-02-06 ENCOUNTER — Ambulatory Visit: Payer: BLUE CROSS/BLUE SHIELD | Admitting: Endocrinology

## 2016-02-09 ENCOUNTER — Ambulatory Visit: Payer: BLUE CROSS/BLUE SHIELD | Admitting: Endocrinology

## 2016-02-12 ENCOUNTER — Encounter: Payer: Self-pay | Admitting: Family Medicine

## 2016-02-12 ENCOUNTER — Ambulatory Visit (INDEPENDENT_AMBULATORY_CARE_PROVIDER_SITE_OTHER): Payer: BLUE CROSS/BLUE SHIELD

## 2016-02-12 ENCOUNTER — Ambulatory Visit: Payer: BLUE CROSS/BLUE SHIELD | Admitting: Family Medicine

## 2016-02-12 ENCOUNTER — Ambulatory Visit (INDEPENDENT_AMBULATORY_CARE_PROVIDER_SITE_OTHER): Payer: BLUE CROSS/BLUE SHIELD | Admitting: Family Medicine

## 2016-02-12 VITALS — BP 118/86 | HR 115 | Temp 98.3°F | Resp 18 | Wt 259.1 lb

## 2016-02-12 DIAGNOSIS — J209 Acute bronchitis, unspecified: Secondary | ICD-10-CM

## 2016-02-12 MED ORDER — DOXYCYCLINE HYCLATE 100 MG PO TABS: 100.0000 mg | ORAL_TABLET | Freq: Two times a day (BID) | ORAL | 0 refills | Status: DC

## 2016-02-12 MED ORDER — HYDROCOD POLST-CPM POLST ER 10-8 MG/5ML PO SUER: 5.0000 mL | Freq: Two times a day (BID) | ORAL | 0 refills | Status: DC | PRN

## 2016-02-12 NOTE — Patient Instructions (Signed)
Take the medication as directed.  Call with concerns.  Take care  Dr. Adriana Simasook

## 2016-02-12 NOTE — Progress Notes (Signed)
Subjective:  Patient ID: Karen Bolton, female    DOB: 08-02-1980  Age: 35 y.o. MRN: [id number]  CC: Cough, congestion.  HPI:  35 year old female presents with the above complaints.  Patient was recently seen earlier this month and was diagnosed with acute bronchitis. She was treated and was improving although she still had a nagging cough.  Her husband recently got sick and she contracted the same illness. This occurred on Sunday. She is now experiencing productive cough and congestion. She reports associated fatigue. No shortness of breath no fever. No known exacerbating or relieving factors. No other associated symptoms. No other complaints at this time.  Social Hx   Social History   Social History  . Marital status: Married    Spouse name: N/A  . Number of children: N/A  . Years of education: N/A   Social History Main Topics  . Smoking status: Never Smoker  . Smokeless tobacco: Never Used  . Alcohol use 0.0 oz/week     Comment: Very rare  . Drug use: No  . Sexual activity: Yes    Partners: Male    Birth control/ protection: None   Other Topics Concern  . None   Social History Narrative   Homemaker    Lives with husband, 2 stepchildren, and mother-in-law    Pets- 1 dog inside   Caffeine- No coffee, 2 20 oz bottles soda, no tea, rare chocolate    Enjoys taking kids to the park        Review of Systems  Constitutional: Positive for fatigue.  HENT: Positive for congestion.   Respiratory: Positive for cough.    Objective:  BP 118/86 (BP Location: Right Arm, Patient Position: Sitting, Cuff Size: Large)   Pulse (!) 115   Temp 98.3 F (36.8 C) (Oral)   Resp 18   Wt 259 lb 2 oz (117.5 kg)   SpO2 96%   BMI 49.37 kg/m   BP/Weight 02/12/2016 01/13/2016 12/16/2015  Systolic BP 118 122 134  Diastolic BP 86 86 84  Wt. (Lbs) 259.13 260 256.38  BMI 49.37 49.53 48.84   Physical Exam  Constitutional: She is oriented to person, place, and time. She appears  well-developed. No distress.  HENT:  Mouth/Throat: Oropharynx is clear and moist.  Cardiovascular: Regular rhythm.  Tachycardia present.   Pulmonary/Chest: Effort normal.  Initially coarse breath sounds but cleared with cough.  Neurological: She is alert and oriented to person, place, and time.  Psychiatric: She has a normal mood and affect.  Vitals reviewed.  Lab Results  Component Value Date   WBC 10.7 05/20/2014   HGB 12.7 05/20/2014   HCT 38.0 05/20/2014   PLT 292 05/20/2014   GLUCOSE 93 06/12/2015   CHOL 146 06/12/2015   TRIG 151.0 (H) 06/12/2015   HDL 38.40 (L) 06/12/2015   LDLCALC 77 06/12/2015   ALT 23 06/12/2015   AST 25 06/12/2015   NA 136 06/12/2015   K 4.0 06/12/2015   CL 104 06/12/2015   CREATININE 0.78 06/12/2015   BUN 14 06/12/2015   CO2 23 06/12/2015   TSH 7.14 (H) 08/21/2015   HGBA1C 5.6 07/30/2015    Assessment & Plan:   Problem List Items Addressed This Visit    Subacute bronchitis - Primary    Recent bronchitis with recurrence/worsening. Chest x-ray today. Treating with Tussionex and doxycycline.      Relevant Orders   DG Chest 2 View    Other Visit Diagnoses   None.  Meds ordered this encounter  Medications  . doxycycline (VIBRA-TABS) 100 MG tablet    Sig: Take 1 tablet (100 mg total) by mouth 2 (two) times daily.    Dispense:  20 tablet    Refill:  0  . chlorpheniramine-HYDROcodone (TUSSIONEX PENNKINETIC ER) 10-8 MG/5ML SUER    Sig: Take 5 mLs by mouth every 12 (twelve) hours as needed.    Dispense:  115 mL    Refill:  0    Follow-up: PRN  Everlene OtherJayce Eliaz Fout DO [facility] [place]

## 2016-02-12 NOTE — Assessment & Plan Note (Signed)
Recent bronchitis with recurrence/worsening. Chest x-ray today. Treating with Tussionex and doxycycline.

## 2016-02-12 NOTE — Progress Notes (Signed)
Pre visit review using our clinic review tool, if applicable. No additional management support is needed unless otherwise documented below in the visit note. 

## 2016-02-23 ENCOUNTER — Ambulatory Visit: Payer: BLUE CROSS/BLUE SHIELD | Admitting: Endocrinology

## 2016-03-03 ENCOUNTER — Telehealth: Payer: Self-pay | Admitting: Family Medicine

## 2016-03-03 NOTE — Telephone Encounter (Signed)
Spoke with the patient, she was seen on 11/2, she completed the 20 pills of Doxycycline and used the Tussionex, she still has horrible cough, not full voice,  that is producing little drainage.  She is SOB at times from the cough.  No fevers, no chillls, has had a headache off and on for about a week, no other symptoms.  Has been using OTC Delsym with little relief.  Please advise.

## 2016-03-03 NOTE — Telephone Encounter (Signed)
I treated her aggressively. Chest x-ray was negative for pneumonia. If she continues to have symptoms she will need to be seen. My recommendation is to urgent care or the walk-in clinic.

## 2016-03-03 NOTE — Telephone Encounter (Signed)
Pt called and state that she is not feeling any better and she is still having coughing fits,and stated that she finished all the cough syrup and antibiotic. Please advise, thank you!  Call pt @ [phone]

## 2016-03-03 NOTE — Telephone Encounter (Signed)
Patient advised of below and encouraged to go to urgent care . Advised patient where local urgent care were she plans on going to be checked out by them today.

## 2016-03-03 NOTE — Telephone Encounter (Signed)
Please inform patient, thanks!

## 2016-03-15 ENCOUNTER — Ambulatory Visit: Payer: BLUE CROSS/BLUE SHIELD | Admitting: Endocrinology

## 2016-04-01 ENCOUNTER — Encounter: Payer: Self-pay | Admitting: Family Medicine

## 2016-04-01 ENCOUNTER — Ambulatory Visit (INDEPENDENT_AMBULATORY_CARE_PROVIDER_SITE_OTHER): Payer: BLUE CROSS/BLUE SHIELD | Admitting: Family Medicine

## 2016-04-01 DIAGNOSIS — R200 Anesthesia of skin: Secondary | ICD-10-CM

## 2016-04-01 MED ORDER — GABAPENTIN 300 MG PO CAPS: 300.0000 mg | ORAL_CAPSULE | Freq: Three times a day (TID) | ORAL | 0 refills | Status: DC

## 2016-04-01 MED ORDER — CYCLOBENZAPRINE HCL 10 MG PO TABS: 10.0000 mg | ORAL_TABLET | Freq: Three times a day (TID) | ORAL | 0 refills | Status: DC | PRN

## 2016-04-01 NOTE — Patient Instructions (Signed)
Try the gabapentin.  Flexeril as needed.  Take care  Dr. Adriana Simasook

## 2016-04-01 NOTE — Progress Notes (Signed)
Pre visit review using our clinic review tool, if applicable. No additional management support is needed unless otherwise documented below in the visit note. 

## 2016-04-05 DIAGNOSIS — R200 Anesthesia of skin: Secondary | ICD-10-CM | POA: Insufficient documentation

## 2016-04-05 NOTE — Progress Notes (Signed)
Subjective:  Patient ID: Karen Bolton, female    DOB: 02-Dec-1980  Age: 35 y.o. MRN: [id number]  CC: Numbness in fingers  HPI:  35 year old female presents with the above complaints.  Patient reports a 2 week history of numbness in the fingers/fingertips. Mild to moderate in severity. R hand > Left hand.  Affects all digits. No recent fall, trauma, injury. No exacerbating or relieving factors. No other associated symptoms. She does reports some posterior neck pain and headache. No other complaints or concerns at this time.  Social Hx   Social History   Social History  . Marital status: Married    Spouse name: N/A  . Number of children: N/A  . Years of education: N/A   Social History Main Topics  . Smoking status: Never Smoker  . Smokeless tobacco: Never Used  . Alcohol use 0.0 oz/week     Comment: Very rare  . Drug use: No  . Sexual activity: Yes    Partners: Male    Birth control/ protection: None   Other Topics Concern  . None   Social History Narrative   Homemaker    Lives with husband, 2 stepchildren, and mother-in-law    Pets- 1 dog inside   Caffeine- No coffee, 2 20 oz bottles soda, no tea, rare chocolate    Enjoys taking kids to the park        Review of Systems  Constitutional: Negative.   Neurological: Positive for numbness.   Objective:  BP 134/84   Pulse 82   Temp 97.7 F (36.5 C) (Oral)   Resp 14   Wt 259 lb 2 oz (117.5 kg)   SpO2 96%   BMI 49.37 kg/m   BP/Weight 04/01/2016 02/12/2016 01/13/2016  Systolic BP 134 118 122  Diastolic BP 84 86 86  Wt. (Lbs) 259.13 259.13 260  BMI 49.37 49.37 49.53   Physical Exam  Constitutional: She is oriented to person, place, and time. She appears well-developed. No distress.  Cardiovascular: Normal rate and regular rhythm.   Pulmonary/Chest: Effort normal and breath sounds normal.  Neurological: She is alert and oriented to person, place, and time.  Negative tinel's and phalens.   Psychiatric: She has  a normal mood and affect.  Vitals reviewed.  Lab Results  Component Value Date   WBC 10.7 05/20/2014   HGB 12.7 05/20/2014   HCT 38.0 05/20/2014   PLT 292 05/20/2014   GLUCOSE 93 06/12/2015   CHOL 146 06/12/2015   TRIG 151.0 (H) 06/12/2015   HDL 38.40 (L) 06/12/2015   LDLCALC 77 06/12/2015   ALT 23 06/12/2015   AST 25 06/12/2015   NA 136 06/12/2015   K 4.0 06/12/2015   CL 104 06/12/2015   CREATININE 0.78 06/12/2015   BUN 14 06/12/2015   CO2 23 06/12/2015   TSH 7.14 (H) 08/21/2015   HGBA1C 5.6 07/30/2015    Assessment & Plan:   Problem List Items Addressed This Visit    Hand numbness    New problem.  Uncertain etiology. ? Cervical radiculopathy (unsure given that it affects all digits). No signs of carpal tunnel. Discussed need for nerve conduction test. Patient declined.  Empiric trial of gabapentin and flexeril.          Meds ordered this encounter  Medications  . cyclobenzaprine (FLEXERIL) 10 MG tablet    Sig: Take 1 tablet (10 mg total) by mouth 3 (three) times daily as needed (Head, neck pain.).    Dispense:  90 tablet    Refill:  0  . gabapentin (NEURONTIN) 300 MG capsule    Sig: Take 1 capsule (300 mg total) by mouth 3 (three) times daily.    Dispense:  90 capsule    Refill:  0    Follow-up: PRN  Everlene OtherJayce Nicklaus Alviar DO [facility] [place]

## 2016-04-05 NOTE — Assessment & Plan Note (Addendum)
New problem.  Uncertain etiology. ? Cervical radiculopathy (unsure given that it affects all digits). No signs of carpal tunnel. Discussed need for nerve conduction test. Patient declined.  Empiric trial of gabapentin and flexeril.

## 2016-04-28 ENCOUNTER — Other Ambulatory Visit: Payer: Self-pay | Admitting: Family Medicine

## 2016-04-30 ENCOUNTER — Telehealth: Payer: Self-pay | Admitting: Family Medicine

## 2016-04-30 NOTE — Telephone Encounter (Signed)
Reason for call:chest congestion Symptoms:coughing ,sore throat, runny nose, no fever , chest congestion ,  just chills, she hasn't had flu shot, shortness, breath with walking if going to grocery store since the 1 st of the week.  Duration 2 weeks  Medications: Alka Selter Cold/Cough , Dayquil Delsym Please Advise appt scheduled for Tuesday , urged to go urgent care for evaluation.

## 2016-04-30 NOTE — Telephone Encounter (Signed)
Pt called c/o coughing, sneezing, gaging, congestion, sore throat, runny nose, green phlegm./mucos. She has been taking alka seltzer cold and cough , dayquil, delsym. Please advise, thank you!  Call pt @ [phone]

## 2016-04-30 NOTE — Telephone Encounter (Addendum)
Tried calling patient no answer unable to leave message  

## 2016-04-30 NOTE — Telephone Encounter (Signed)
Refilled 04/01/16. Pt last seen 04/01/16 for hand numbness. Please advise?

## 2016-04-30 NOTE — Telephone Encounter (Signed)
Needs to be seen or needs to go to urgent care.

## 2016-05-04 ENCOUNTER — Ambulatory Visit: Payer: BLUE CROSS/BLUE SHIELD | Admitting: Family Medicine

## 2016-05-04 ENCOUNTER — Telehealth: Payer: Self-pay | Admitting: Family Medicine

## 2016-05-04 NOTE — Telephone Encounter (Signed)
FYI - Pt called and cancelled appt, she has a flat tire.

## 2016-05-04 NOTE — Telephone Encounter (Signed)
PCP aware

## 2016-05-06 ENCOUNTER — Ambulatory Visit: Payer: BLUE CROSS/BLUE SHIELD | Admitting: Endocrinology

## 2016-05-06 ENCOUNTER — Ambulatory Visit: Payer: BLUE CROSS/BLUE SHIELD | Admitting: Family Medicine

## 2016-05-07 NOTE — Progress Notes (Signed)
This encounter was created in error - please disregard.

## 2016-05-19 ENCOUNTER — Ambulatory Visit: Payer: BLUE CROSS/BLUE SHIELD | Admitting: Endocrinology

## 2016-06-02 ENCOUNTER — Encounter: Payer: Self-pay | Admitting: Family Medicine

## 2016-06-02 ENCOUNTER — Encounter: Payer: Self-pay | Admitting: Endocrinology

## 2016-06-02 ENCOUNTER — Ambulatory Visit (INDEPENDENT_AMBULATORY_CARE_PROVIDER_SITE_OTHER): Payer: BLUE CROSS/BLUE SHIELD | Admitting: Family Medicine

## 2016-06-02 ENCOUNTER — Ambulatory Visit (INDEPENDENT_AMBULATORY_CARE_PROVIDER_SITE_OTHER): Payer: BLUE CROSS/BLUE SHIELD | Admitting: Endocrinology

## 2016-06-02 VITALS — BP 112/80 | HR 104 | Wt 259.0 lb

## 2016-06-02 DIAGNOSIS — E038 Other specified hypothyroidism: Secondary | ICD-10-CM

## 2016-06-02 DIAGNOSIS — M545 Low back pain, unspecified: Secondary | ICD-10-CM

## 2016-06-02 DIAGNOSIS — E063 Autoimmune thyroiditis: Secondary | ICD-10-CM

## 2016-06-02 MED ORDER — TIZANIDINE HCL 4 MG PO CAPS: 4.0000 mg | ORAL_CAPSULE | Freq: Three times a day (TID) | ORAL | 0 refills | Status: DC | PRN

## 2016-06-02 MED ORDER — TRAMADOL HCL 50 MG PO TABS: 50.0000 mg | ORAL_TABLET | Freq: Three times a day (TID) | ORAL | 0 refills | Status: DC | PRN

## 2016-06-02 MED ORDER — DICLOFENAC SODIUM 75 MG PO TBEC: 75.0000 mg | DELAYED_RELEASE_TABLET | Freq: Two times a day (BID) | ORAL | 0 refills | Status: DC

## 2016-06-02 NOTE — Progress Notes (Signed)
Subjective:  Patient ID: Karen Bolton, female    DOB: 01-Dec-1980  Age: 36 y.o. MRN: [id number]  CC: Low back pain  HPI:  36 year old female presents with complaints of low back pain.  Low back pain  Started 3 days ago.  R low back.  No known fall, trauma, injury.  Worse with activity.  She has been using ibuprofen with no improvement.  No known relieving factors.  No radicular symptoms.  No other associated symptoms.  No other complaints or concerns at this time.   Social Hx   Social History   Social History  . Marital status: Married    Spouse name: N/A  . Number of children: N/A  . Years of education: N/A   Social History Main Topics  . Smoking status: Never Smoker  . Smokeless tobacco: Never Used  . Alcohol use 0.0 oz/week     Comment: Very rare  . Drug use: No  . Sexual activity: Yes    Partners: Male    Birth control/ protection: None   Other Topics Concern  . None   Social History Narrative   Homemaker    Lives with husband, 2 stepchildren, and mother-in-law    Pets- 1 dog inside   Caffeine- No coffee, 2 20 oz bottles soda, no tea, rare chocolate    Enjoys taking kids to the park         Review of Systems  Constitutional: Negative.   Musculoskeletal: Positive for back pain.   Objective:  Pulse 92   Temp 98 F (36.7 C) (Oral)   Wt 259 lb 6.4 oz (117.7 kg)   SpO2 97%   BMI 49.42 kg/m   BP/Weight 06/02/2016 04/01/2016 02/12/2016  Systolic BP - 134 118  Diastolic BP - 84 86  Wt. (Lbs) 259.4 259.13 259.13  BMI 49.42 49.37 49.37   Physical Exam  Constitutional: She is oriented to person, place, and time. She appears well-developed. No distress.  Cardiovascular: Normal rate and regular rhythm.   Pulmonary/Chest: Effort normal and breath sounds normal.  Musculoskeletal:  Low back - R paraspinal muscle tenderness (severe). Decreased ROM.  Neurological: She is alert and oriented to person, place, and time.  Psychiatric: She has a  normal mood and affect.  Vitals reviewed.   Lab Results  Component Value Date   WBC 10.7 05/20/2014   HGB 12.7 05/20/2014   HCT 38.0 05/20/2014   PLT 292 05/20/2014   GLUCOSE 93 06/12/2015   CHOL 146 06/12/2015   TRIG 151.0 (H) 06/12/2015   HDL 38.40 (L) 06/12/2015   LDLCALC 77 06/12/2015   ALT 23 06/12/2015   AST 25 06/12/2015   NA 136 06/12/2015   K 4.0 06/12/2015   CL 104 06/12/2015   CREATININE 0.78 06/12/2015   BUN 14 06/12/2015   CO2 23 06/12/2015   TSH 7.14 (H) 08/21/2015   HGBA1C 5.6 07/30/2015    Assessment & Plan:   Problem List Items Addressed This Visit    Low back pain    New acute problem. MSK in origin; significant spasm. Treating with Diclofenac, Tramadol, Zanaflex.      Relevant Medications   diclofenac (VOLTAREN) 75 MG EC tablet   traMADol (ULTRAM) 50 MG tablet   tiZANidine (ZANAFLEX) 4 MG capsule      Meds ordered this encounter  Medications  . diclofenac (VOLTAREN) 75 MG EC tablet    Sig: Take 1 tablet (75 mg total) by mouth 2 (two) times daily.  Dispense:  30 tablet    Refill:  0  . traMADol (ULTRAM) 50 MG tablet    Sig: Take 1 tablet (50 mg total) by mouth every 8 (eight) hours as needed.    Dispense:  30 tablet    Refill:  0  . tiZANidine (ZANAFLEX) 4 MG capsule    Sig: Take 1 capsule (4 mg total) by mouth 3 (three) times daily as needed for muscle spasms.    Dispense:  30 capsule    Refill:  0    Follow-up: PRN  Everlene OtherJayce Luismanuel Corman DO [facility] [place]

## 2016-06-02 NOTE — Progress Notes (Signed)
Pre visit review using our clinic review tool, if applicable. No additional management support is needed unless otherwise documented below in the visit note. 

## 2016-06-02 NOTE — Patient Instructions (Signed)
If you are starting levothyroxine as a supplement this should be taken before breakfast daily Did not take any iron or calcium-containing vitamins at the same time

## 2016-06-02 NOTE — Assessment & Plan Note (Signed)
New acute problem. MSK in origin; significant spasm. Treating with Diclofenac, Tramadol, Zanaflex.

## 2016-06-02 NOTE — Progress Notes (Signed)
Patient ID: Karen Lewandowskyamela Bolton, female   DOB: [birth year], 36 y.o.   MRN: [id number]            Referring Physician: Adriana Simasook  Reason for Appointment:  Hypothyroidism, new visit    History of Present Illness:   Hypothyroidism was first diagnosed in 3/17  At the time of diagnosis patient was having symptoms of  fatigue, cold sensitivity and dry skin for about a 2 year. Has had only mild hair loss She has difficulty losing weight although has not gained any recently  She had repeat testing done the following 2 months and was referred to be seen here for further management but she has not been able to make her appointment until now She continues to have the same symptoms No recent labs available          Patient's weight history is as follows:  Wt Readings from Last 3 Encounters:  06/02/16 259 lb (117.5 kg)  06/02/16 259 lb 6.4 oz (117.7 kg)  04/01/16 259 lb 2 oz (117.5 kg)    Thyroid function results have been as follows:  Lab Results  Component Value Date   FREET4 0.66 08/21/2015   FREET4 0.81 07/30/2015   TSH 7.14 (H) 08/21/2015   TSH 5.09 (H) 07/30/2015   TSH 4.85 (H) 06/12/2015     Past Medical History:  Diagnosis Date  . Anxiety   . Depression   . History of miscarriage    x2  . Insomnia   . Morbid obesity (HCC)     Past Surgical History:  Procedure Laterality Date  . APPENDECTOMY     2016  . CHOLECYSTECTOMY    . OOPHORECTOMY  2010   right ovary removed  . WISDOM TOOTH EXTRACTION N/A 2001    Family History  Problem Relation Age of Onset  . Cancer Mother 5946    Breast Cancer  . Cancer Maternal Aunt 35    ovarian Cancer  . Heart disease Father   . Diabetes Father   . Sleep apnea Father   . Alcohol abuse Father   . Cancer Paternal Grandmother     breast cancer  . Cancer Cousin     breast cancer  . Cancer Maternal Uncle     pancreatic cancer  . Thyroid cancer Sister   . Hypothyroidism Sister     Social History:  reports that she has never  smoked. She has never used smokeless tobacco. She reports that she drinks alcohol. She reports that she does not use drugs.  Allergies:  Allergies  Allergen Reactions  . Penicillins Swelling  . Dilaudid [Hydromorphone Hcl] Rash    Allergies as of 06/02/2016      Reactions   Penicillins Swelling   Dilaudid [hydromorphone Hcl] Rash      Medication List       Accurate as of 06/02/16 11:59 PM. Always use your most recent med list.          ALPRAZolam 0.5 MG tablet Commonly known as:  XANAX   diclofenac 75 MG EC tablet Commonly known as:  VOLTAREN Take 1 tablet (75 mg total) by mouth 2 (two) times daily.   gabapentin 300 MG capsule Commonly known as:  NEURONTIN TAKE 1 CAPSULE (300 MG TOTAL) BY MOUTH 3 (THREE) TIMES DAILY.   QUEtiapine 200 MG tablet Commonly known as:  SEROQUEL Take 200 mg by mouth at bedtime.   sertraline 50 MG tablet Commonly known as:  ZOLOFT Take 50 mg by mouth. Take 25mg  daily  tiZANidine 4 MG capsule Commonly known as:  ZANAFLEX Take 1 capsule (4 mg total) by mouth 3 (three) times daily as needed for muscle spasms.   traMADol 50 MG tablet Commonly known as:  ULTRAM Take 1 tablet (50 mg total) by mouth every 8 (eight) hours as needed.          Review of Systems  HENT: Negative for hoarseness and trouble swallowing.   Respiratory: Positive for daytime sleepiness.        This is infrequent  Gastrointestinal: Positive for diarrhea.  Endocrine: Positive for fatigue and cold intolerance. Negative for menstrual changes.       Fatigue for about 2 years  Musculoskeletal: Negative for muscle cramps.  Skin: Positive for dry skin.       Hair loss  Neurological: Positive for numbness.       Some numbness in her hands especially in the mornings And sometimes during the day.  She tends to drop things Gabapentin did not help  Psychiatric/Behavioral: Positive for insomnia.Negative for depressed mood.                Examination:    BP 112/80    Pulse (!) 104   Wt 259 lb (117.5 kg)   SpO2 97%   BMI 49.34 kg/m   GENERAL:  Average build. She has moderate generalized obesity  No pallor, clubbing, lymphadenopathy or edema.    Skin:  no rash, has minimal acanthosis present, no hirsutism  EYES:  No prominence of the eyes or swelling of the eyelids  ENT: Oral mucosa and tongue normal.  THYROID:  Not palpable.  HEART:  Normal  S1 and S2; no murmur or click.  CHEST:    Lungs: Vescicular breath sounds heard equally.  No crepitations/ wheeze.  ABDOMEN:  No distention.  Liver and spleen not palpable.  No other mass or tenderness.  NEUROLOGICAL: Reflexes are bilaterally 1+ at biceps, no delayed relaxation.  Tinel sign positive on the left  JOINTS:  Normal.   Assessment:  HYPOTHYROIDISM, likely to be autoimmune nature family history reportedly of hypothyroidism also Although her TSH has been only mildly high she may have other reasons for fatigue she does appear to be symptomatic from hypothyroidism Has not had a TSH tested since 5/17 No goiter on exam Her exam was unremarkable otherwise  PLAN:   Check thyroid levels today and decide on dosage of levothyroxine Discussed Hashimoto's thyroiditis an autoimmune thyroid disease Discussed thyroid supplementation Follow-up based on labs   [facility] 06/03/2016, 9:16 AM   Consultation note copy sent to the PCP  Note: This office note was prepared with Dragon voice recognition system technology. Any transcriptional errors that result from this process are unintentional.

## 2016-06-02 NOTE — Patient Instructions (Signed)
Diclofenac twice daily for the next week.  Use the muscle relaxer as needed.  Use the tramadol as needed if your pain is not controlled.  Take care  Dr. Adriana Simasook

## 2016-06-03 LAB — T4, FREE: Free T4: 0.66 ng/dL (ref 0.60–1.60)

## 2016-06-03 LAB — T3, FREE: T3, Free: 3.5 pg/mL (ref 2.3–4.2)

## 2016-06-03 LAB — TSH: TSH: 12.2 u[IU]/mL — AB (ref 0.35–4.50)

## 2016-06-04 ENCOUNTER — Telehealth: Payer: Self-pay | Admitting: Endocrinology

## 2016-06-04 MED ORDER — LEVOTHYROXINE SODIUM 50 MCG PO TABS: 50.0000 ug | ORAL_TABLET | Freq: Every day | ORAL | 1 refills | Status: DC

## 2016-06-04 NOTE — Telephone Encounter (Signed)
Pt is aware.  

## 2016-06-04 NOTE — Telephone Encounter (Signed)
Please let her know that the thyroid level is lower than before Start levothyroxine supplement, 50 g once daily before breakfast, prescription has been sent

## 2016-06-08 ENCOUNTER — Telehealth: Payer: Self-pay | Admitting: Family Medicine

## 2016-06-08 NOTE — Telephone Encounter (Signed)
Pt lvm requesting a referral to a hand specialist. States that her hand is getting worse. Please advise. Pt cb [phone]

## 2016-06-09 NOTE — Telephone Encounter (Signed)
Recommendations?

## 2016-06-09 NOTE — Telephone Encounter (Signed)
Recommend neurology referral for nerve conduction study (due to hand numbness). Doesn't need to see a Hydrographic surveyorhand surgeon.

## 2016-06-10 ENCOUNTER — Other Ambulatory Visit: Payer: Self-pay | Admitting: Family Medicine

## 2016-06-10 DIAGNOSIS — R200 Anesthesia of skin: Secondary | ICD-10-CM

## 2016-06-10 NOTE — Telephone Encounter (Signed)
Pt okay with referral for neurology.

## 2016-06-28 ENCOUNTER — Emergency Department: Payer: BLUE CROSS/BLUE SHIELD

## 2016-06-28 ENCOUNTER — Telehealth: Payer: Self-pay | Admitting: Family Medicine

## 2016-06-28 ENCOUNTER — Encounter: Payer: Self-pay | Admitting: Emergency Medicine

## 2016-06-28 ENCOUNTER — Emergency Department
Admission: EM | Admit: 2016-06-28 | Discharge: 2016-06-28 | Disposition: A | Discharge status: Home / Self Care | Payer: BLUE CROSS/BLUE SHIELD | Attending: Emergency Medicine | Admitting: Emergency Medicine

## 2016-06-28 DIAGNOSIS — S93402A Sprain of unspecified ligament of left ankle, initial encounter: Secondary | ICD-10-CM

## 2016-06-28 DIAGNOSIS — Y999 Unspecified external cause status: Secondary | ICD-10-CM | POA: Diagnosis not present

## 2016-06-28 DIAGNOSIS — S99912A Unspecified injury of left ankle, initial encounter: Secondary | ICD-10-CM | POA: Diagnosis present

## 2016-06-28 DIAGNOSIS — Y9389 Activity, other specified: Secondary | ICD-10-CM | POA: Diagnosis not present

## 2016-06-28 DIAGNOSIS — Y929 Unspecified place or not applicable: Secondary | ICD-10-CM | POA: Diagnosis not present

## 2016-06-28 DIAGNOSIS — Z79899 Other long term (current) drug therapy: Secondary | ICD-10-CM | POA: Insufficient documentation

## 2016-06-28 DIAGNOSIS — E039 Hypothyroidism, unspecified: Secondary | ICD-10-CM | POA: Insufficient documentation

## 2016-06-28 MED ORDER — OXYCODONE-ACETAMINOPHEN 7.5-325 MG PO TABS: 1.0000 | ORAL_TABLET | Freq: Four times a day (QID) | ORAL | 0 refills | Status: DC | PRN

## 2016-06-28 MED ORDER — OXYCODONE-ACETAMINOPHEN 5-325 MG PO TABS
2.0000 | ORAL_TABLET | Freq: Once | ORAL | Status: AC
  Administered 2016-06-28: 2 via ORAL
  Filled 2016-06-28: qty 2

## 2016-06-28 MED ORDER — IBUPROFEN 800 MG PO TABS: 800.0000 mg | ORAL_TABLET | Freq: Three times a day (TID) | ORAL | 0 refills | Status: DC | PRN
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
## 2016-06-28 NOTE — Telephone Encounter (Signed)
Pt called and stated that her left ankle is bruised and swollen and cannot put pressure on it. No available appts today. Please advise, thank you!  Call pt @ [phone]

## 2016-06-28 NOTE — Discharge Instructions (Signed)
Wear splint when awake and ambulate with crutches for 3-5 days as needed.

## 2016-06-28 NOTE — Telephone Encounter (Signed)
Patient advised to go to urgent care for evaluation

## 2016-07-15 ENCOUNTER — Encounter: Payer: Self-pay | Admitting: Endocrinology

## 2016-07-15 ENCOUNTER — Ambulatory Visit (INDEPENDENT_AMBULATORY_CARE_PROVIDER_SITE_OTHER): Payer: BLUE CROSS/BLUE SHIELD | Admitting: Endocrinology

## 2016-07-15 VITALS — BP 128/86 | HR 96 | Ht 60.75 in | Wt 259.0 lb

## 2016-07-15 DIAGNOSIS — E038 Other specified hypothyroidism: Secondary | ICD-10-CM | POA: Diagnosis not present

## 2016-07-15 DIAGNOSIS — E063 Autoimmune thyroiditis: Secondary | ICD-10-CM

## 2016-07-15 LAB — T3, FREE: T3 FREE: 4.4 pg/mL — AB (ref 2.3–4.2)

## 2016-07-15 LAB — TSH: TSH: 4.88 u[IU]/mL — ABNORMAL HIGH (ref 0.35–4.50)

## 2016-07-15 LAB — T4, FREE: FREE T4: 0.83 ng/dL (ref 0.60–1.60)

## 2016-07-15 NOTE — Progress Notes (Signed)
Patient ID: Karen Bolton, female   DOB: 02-19-1981, 36 y.o.   MRN: [id number]            Referring Physician: Adriana Simas  Reason for Appointment:  Hypothyroidism, follow-up     History of Present Illness:   Hypothyroidism was first diagnosed in 3/17  At the time of diagnosis patient was having symptoms of  fatigue, cold sensitivity, mild hair loss and dry skin for about 2 years She has difficulty losing weight   She had repeat testing done which again showed high TSH but she was first seen in follow-up in 05/2016 only On this visit she was having the same symptoms as above  TSH done in 2/18 was significantly higher at 12.2 She was started on 50 g of levothyroxine which she is taking consistently in the morning on waking up She thinks she may be feeling about 20-30% better with her energy level, does not see any change in her cold sensitivity Weight is about the same Does not think she has mood swings         Patient's weight history is as follows:  Wt Readings from Last 3 Encounters:  07/15/16 259 lb (117.5 kg)  06/28/16 240 lb (108.9 kg)  06/02/16 259 lb (117.5 kg)    Thyroid function results have been as follows:  Lab Results  Component Value Date   TSH 12.20 (H) 06/02/2016   TSH 7.14 (H) 08/21/2015   TSH 5.09 (H) 07/30/2015   FREET4 0.66 06/02/2016   FREET4 0.66 08/21/2015   FREET4 0.81 07/30/2015      Past Medical History:  Diagnosis Date  . Anxiety   . Depression   . History of miscarriage    x2  . Insomnia   . Morbid obesity (HCC)     Past Surgical History:  Procedure Laterality Date  . APPENDECTOMY     2016  . CHOLECYSTECTOMY    . OOPHORECTOMY  2010   right ovary removed  . WISDOM TOOTH EXTRACTION N/A 2001    Family History  Problem Relation Age of Onset  . Cancer Mother 73    Breast Cancer  . Cancer Maternal Aunt 35    ovarian Cancer  . Heart disease Father   . Diabetes Father   . Sleep apnea Father   . Alcohol abuse Father   . Cancer  Paternal Grandmother     breast cancer  . Cancer Cousin     breast cancer  . Cancer Maternal Uncle     pancreatic cancer  . Thyroid cancer Sister   . Hypothyroidism Sister     Social History:  reports that she has never smoked. She has never used smokeless tobacco. She reports that she drinks alcohol. She reports that she does not use drugs.  Allergies:  Allergies  Allergen Reactions  . Penicillins Swelling  . Dilaudid [Hydromorphone Hcl] Rash    Allergies as of 07/15/2016      Reactions   Penicillins Swelling   Dilaudid [hydromorphone Hcl] Rash      Medication List       Accurate as of 07/15/16  3:55 PM. Always use your most recent med list.          ALPRAZolam 0.5 MG tablet Commonly known as:  XANAX   diclofenac 75 MG EC tablet Commonly known as:  VOLTAREN Take 1 tablet (75 mg total) by mouth 2 (two) times daily.   gabapentin 300 MG capsule Commonly known as:  NEURONTIN TAKE 1 CAPSULE (300  MG TOTAL) BY MOUTH 3 (THREE) TIMES DAILY.   ibuprofen 800 MG tablet Commonly known as:  ADVIL,MOTRIN Take 1 tablet (800 mg total) by mouth every 8 (eight) hours as needed for moderate pain.   levothyroxine 50 MCG tablet Commonly known as:  SYNTHROID, LEVOTHROID Take 1 tablet (50 mcg total) by mouth daily.   oxyCODONE-acetaminophen 7.5-325 MG tablet Commonly known as:  PERCOCET Take 1 tablet by mouth every 6 (six) hours as needed for severe pain.   QUEtiapine 200 MG tablet Commonly known as:  SEROQUEL Take 200 mg by mouth at bedtime.   sertraline 50 MG tablet Commonly known as:  ZOLOFT Take 50 mg by mouth. Take  daily   tiZANidine 4 MG capsule Commonly known as:  ZANAFLEX Take 1 capsule (4 mg total) by mouth 3 (three) times daily as needed for muscle spasms.   traMADol 50 MG tablet Commonly known as:  ULTRAM Take 1 tablet (50 mg total) by mouth every 8 (eight) hours as needed.          Review of Systems  HENT: Negative for hoarseness and trouble  swallowing.   Respiratory: Positive for daytime sleepiness.        This is infrequent  Gastrointestinal: Positive for diarrhea.  Endocrine: Positive for fatigue and cold intolerance. Negative for menstrual changes.       Fatigue for about 2 years  Musculoskeletal: Negative for muscle cramps.  Skin: Positive for dry skin.       Hair loss  Neurological: Positive for numbness.       Some numbness in her hands especially in the mornings And sometimes during the day.  She tends to drop things Gabapentin did not help  Psychiatric/Behavioral: Positive for insomnia.Negative for depressed mood.                Examination:    BP 128/86   Pulse 96   Ht 5' 0.75" (1.543 m)   Wt 259 lb (117.5 kg)   SpO2 96%   BMI 49.34 kg/m   No thyroid enlargement felt   Reflexes are appearing normal at biceps    Assessment:  HYPOTHYROIDISM, likely to be autoimmune nature with progressively higher TSH over the last year Baseline TSH 12.2 With levothyroxine 50 g daily she does not think she is feeling much better  PLAN:   Check thyroid levels today and decide on dosage of levothyroxine Also will check T3 level and consider a combination with Cytomel or use Armour Thyroid if there are T3 is significantly low Patient will be called regarding the plan after labs are available  [facility] 07/15/2016, 3:55 PM    Note: This office note was prepared with Dragon voice recognition system technology. Any transcriptional errors that result from this process are unintentional.

## 2016-07-19 ENCOUNTER — Encounter: Payer: BLUE CROSS/BLUE SHIELD | Admitting: Neurology

## 2016-07-19 ENCOUNTER — Telehealth: Payer: Self-pay | Admitting: Neurology

## 2016-07-19 NOTE — Telephone Encounter (Signed)
This patient did not show for an EMG and nerve conduction study evaluation today. 

## 2016-07-20 ENCOUNTER — Encounter: Payer: Self-pay | Admitting: Neurology

## 2016-08-04 ENCOUNTER — Ambulatory Visit: Payer: BLUE CROSS/BLUE SHIELD | Admitting: Family Medicine

## 2016-08-04 ENCOUNTER — Telehealth: Payer: Self-pay | Admitting: Family Medicine

## 2016-08-04 NOTE — Telephone Encounter (Signed)
PCP aware

## 2016-08-04 NOTE — Telephone Encounter (Signed)
FYI - Pt called and cancelled appt. Pt forgot she had court today.

## 2016-08-05 ENCOUNTER — Encounter: Payer: Self-pay | Admitting: Family Medicine

## 2016-08-05 ENCOUNTER — Ambulatory Visit (INDEPENDENT_AMBULATORY_CARE_PROVIDER_SITE_OTHER): Payer: BLUE CROSS/BLUE SHIELD | Admitting: Family Medicine

## 2016-08-05 DIAGNOSIS — J410 Simple chronic bronchitis: Secondary | ICD-10-CM

## 2016-08-05 MED ORDER — DOXYCYCLINE HYCLATE 100 MG PO TABS: 100.0000 mg | ORAL_TABLET | Freq: Two times a day (BID) | ORAL | 0 refills | Status: DC

## 2016-08-05 MED ORDER — GUAIFENESIN-CODEINE 100-10 MG/5ML PO SOLN: 10.0000 mL | Freq: Three times a day (TID) | ORAL | 0 refills | Status: DC | PRN

## 2016-08-05 NOTE — Progress Notes (Signed)
Pre visit review using our clinic review tool, if applicable. No additional management support is needed unless otherwise documented below in the visit note. 

## 2016-08-05 NOTE — Progress Notes (Signed)
Subjective:  Patient ID: Karen Bolton, female    DOB: 03-27-1981  Age: 36 y.o. MRN: [id number]  CC: Cough, URI symptoms  HPI:  36 year old female presents with the above complaints.  Patient in 6 the past 2 weeks. She's had severe cough, malaise, subjective fever and chills. She's also had associated sinus pressure and congestion. Her husband is also been sick. She states her symptoms have been worsening. She's been taking over-the-counter medications without relief. No reports of shortness of breath. No other complaints or concerns at this time.  Social Hx   Social History   Social History  . Marital status: Married    Spouse name: N/A  . Number of children: N/A  . Years of education: N/A   Social History Main Topics  . Smoking status: Never Smoker  . Smokeless tobacco: Never Used  . Alcohol use 0.0 oz/week     Comment: Very rare  . Drug use: No  . Sexual activity: Yes    Partners: Male    Birth control/ protection: None   Other Topics Concern  . None   Social History Narrative   Homemaker    Lives with husband, 2 stepchildren, and mother-in-law    Pets- 1 dog inside   Caffeine- No coffee, 2 20 oz bottles soda, no tea, rare chocolate    Enjoys taking kids to the park         Review of Systems  Constitutional: Positive for chills.  HENT: Positive for congestion, sinus pain and sinus pressure.   Respiratory: Positive for cough.    Objective:  BP 112/78   Pulse 91   Temp 98.2 F (36.8 C) (Oral)   Wt 258 lb 2 oz (117.1 kg)   SpO2 96%   BMI 49.17 kg/m   BP/Weight 08/05/2016 07/15/2016 06/28/2016  Systolic BP 112 128 121  Diastolic BP 78 86 48  Wt. (Lbs) 258.13 259 240  BMI 49.17 49.34 45.35   Physical Exam  Constitutional: She is oriented to person, place, and time. No distress.  HENT:  Head: Normocephalic and atraumatic.  Mouth/Throat: Oropharynx is clear and moist.  Neck: Neck supple.  Cardiovascular: Normal rate and regular rhythm.     Pulmonary/Chest: Effort normal. She has no wheezes. She has no rales.  Neurological: She is alert and oriented to person, place, and time.  Psychiatric: She has a normal mood and affect.  Vitals reviewed.   Lab Results  Component Value Date   WBC 10.7 05/20/2014   HGB 12.7 05/20/2014   HCT 38.0 05/20/2014   PLT 292 05/20/2014   GLUCOSE 93 06/12/2015   CHOL 146 06/12/2015   TRIG 151.0 (H) 06/12/2015   HDL 38.40 (L) 06/12/2015   LDLCALC 77 06/12/2015   ALT 23 06/12/2015   AST 25 06/12/2015   NA 136 06/12/2015   K 4.0 06/12/2015   CL 104 06/12/2015   CREATININE 0.78 06/12/2015   BUN 14 06/12/2015   CO2 23 06/12/2015   TSH 4.88 (H) 07/15/2016   HGBA1C 5.6 07/30/2015    Assessment & Plan:   Problem List Items Addressed This Visit    Chronic bronchitis, simple (HCC)    Patient with recurrent bouts of bronchitis. Acute issue currently. Treating with Doxy and Guaifenesin/codeine. Will need PFT's in the future.          Meds ordered this encounter  Medications  . guaiFENesin-codeine 100-10 MG/5ML syrup    Sig: Take 10 mLs by mouth every 8 (eight) hours as needed  for cough.    Dispense:  118 mL    Refill:  0  . doxycycline (VIBRA-TABS) 100 MG tablet    Sig: Take 1 tablet (100 mg total) by mouth 2 (two) times daily.    Dispense:  14 tablet    Refill:  0   Follow-up: PRN  Everlene Other DO [facility]

## 2016-08-05 NOTE — Patient Instructions (Signed)

## 2016-08-05 NOTE — Assessment & Plan Note (Signed)
Patient with recurrent bouts of bronchitis. Acute issue currently. Treating with Doxy and Guaifenesin/codeine. Will need PFT's in the future.

## 2016-08-13 ENCOUNTER — Ambulatory Visit: Payer: BLUE CROSS/BLUE SHIELD | Admitting: Podiatry

## 2016-08-23 ENCOUNTER — Encounter: Payer: Self-pay | Admitting: Family Medicine

## 2016-08-23 ENCOUNTER — Ambulatory Visit (INDEPENDENT_AMBULATORY_CARE_PROVIDER_SITE_OTHER): Payer: BLUE CROSS/BLUE SHIELD | Admitting: Family Medicine

## 2016-08-23 VITALS — BP 124/82 | HR 91 | Temp 97.7°F | Wt 257.5 lb

## 2016-08-23 DIAGNOSIS — J410 Simple chronic bronchitis: Secondary | ICD-10-CM | POA: Diagnosis not present

## 2016-08-23 LAB — POCT RAPID STREP A (OFFICE): Rapid Strep A Screen: NEGATIVE

## 2016-08-23 MED ORDER — PREDNISONE 50 MG PO TABS: ORAL_TABLET | ORAL | 0 refills | Status: AC

## 2016-08-23 NOTE — Progress Notes (Signed)
Subjective:  Patient ID: Karen Bolton, female    DOB: [birth year]  Age: 36 y.o. MRN: [id number]  CC: Not better (from recent bronchitis)  HPI:  36 year old female presents for reevaluation.  Patient was recently seen by me and diagnosed with bronchitis. She was treated with doxycycline and cough medication. She's had no improvement. She continues to have cough. She also has congestion and sore throat. No associated fevers or chills. No known exacerbating factors. No other associated symptoms. No other complaints or concerns at this time.  Social Hx   Social History   Social History  . Marital status: Married    Spouse name: N/A  . Number of children: N/A  . Years of education: N/A   Social History Main Topics  . Smoking status: Never Smoker  . Smokeless tobacco: Never Used  . Alcohol use 0.0 oz/week     Comment: Very rare  . Drug use: No  . Sexual activity: Yes    Partners: Male    Birth control/ protection: None   Other Topics Concern  . None   Social History Narrative   Homemaker    Lives with husband, 2 stepchildren, and mother-in-law    Pets- 1 dog inside   Caffeine- No coffee, 2 20 oz bottles soda, no tea, rare chocolate    Enjoys taking kids to the park         Review of Systems  Constitutional: Positive for fatigue.  HENT: Positive for congestion.   Respiratory: Positive for cough.    Objective:  BP 124/82   Pulse 91   Temp 97.7 F (36.5 C) (Oral)   Wt 257 lb 8 oz (116.8 kg)   SpO2 94%   BMI 49.06 kg/m   BP/Weight 08/23/2016 08/05/2016 07/15/2016  Systolic BP 124 112 128  Diastolic BP 82 78 86  Wt. (Lbs) 257.5 258.13 259  BMI 49.06 49.17 49.34    Physical Exam  Constitutional: She is oriented to person, place, and time. She appears well-developed. No distress.  HENT:  Mouth/Throat: Oropharynx is clear and moist.  Cardiovascular: Normal rate and regular rhythm.   Pulmonary/Chest: Effort normal and breath sounds normal. She has no wheezes. She  has no rales.  Neurological: She is alert and oriented to person, place, and time.  Vitals reviewed.   Lab Results  Component Value Date   WBC 10.7 05/20/2014   HGB 12.7 05/20/2014   HCT 38.0 05/20/2014   PLT 292 05/20/2014   GLUCOSE 93 06/12/2015   CHOL 146 06/12/2015   TRIG 151.0 (H) 06/12/2015   HDL 38.40 (L) 06/12/2015   LDLCALC 77 06/12/2015   ALT 23 06/12/2015   AST 25 06/12/2015   NA 136 06/12/2015   K 4.0 06/12/2015   CL 104 06/12/2015   CREATININE 0.78 06/12/2015   BUN 14 06/12/2015   CO2 23 06/12/2015   TSH 4.88 (H) 07/15/2016   HGBA1C 5.6 07/30/2015    Assessment & Plan:   Problem List Items Addressed This Visit    Simple chronic bronchitis (HCC) - Primary    Patient with recurrent bronchitis and upper wrist or infections. [facility]. May need to see allergy. Treating with Prednisone.      Relevant Orders   POCT rapid strep A (Completed)      Meds ordered this encounter  Medications  . predniSONE (DELTASONE) 50 MG tablet    Sig: 1 tablet daily x 5 days.    Dispense:  5 tablet  Refill:  0    Follow-up: PRN  Hasty

## 2016-08-23 NOTE — Patient Instructions (Signed)
Prednisone as prescribed.  We will call with your referral.  Take care  Dr. Adriana Simasook

## 2016-08-23 NOTE — Assessment & Plan Note (Signed)
Patient with recurrent bronchitis and upper wrist or infections. [facility]. May need to see allergy. Treating with Prednisone.

## 2016-08-25 ENCOUNTER — Telehealth: Payer: Self-pay | Admitting: *Deleted

## 2016-08-25 NOTE — Telephone Encounter (Signed)
Patient was seen by Dr.Cook on 05/14. Patient has requested to have something for her cough, her cough has worsen since the visit . [place] [facility] [place]  Pt contact [phone]

## 2016-08-25 NOTE — Telephone Encounter (Signed)
Needs to see pulm.

## 2016-08-25 NOTE — Telephone Encounter (Signed)
Reason for call:cough , requesting medication for cough  Symptoms:having incontinence with coughing spells , hoarse, shortness of breath , cough has worsened , no fever, green mucus with cough Duration  Medications: finished Prednisone  Last seen for this problem: 08/23/16 Seen ON:GEXBby:Cook  Please advise

## 2016-08-26 ENCOUNTER — Other Ambulatory Visit: Payer: Self-pay | Admitting: Family Medicine

## 2016-08-26 ENCOUNTER — Telehealth: Payer: Self-pay | Admitting: Family Medicine

## 2016-08-26 DIAGNOSIS — J410 Simple chronic bronchitis: Secondary | ICD-10-CM

## 2016-08-26 NOTE — Telephone Encounter (Signed)
No note available.

## 2016-08-26 NOTE — Telephone Encounter (Signed)
Pt called this afternoon, she is requesting a referral to pulmonology from our office. Please advise.  Thanks

## 2016-08-26 NOTE — Telephone Encounter (Signed)
Done

## 2016-08-26 NOTE — Telephone Encounter (Signed)
Patient advised she will contact pulmonology.

## 2016-08-26 NOTE — Telephone Encounter (Signed)
Pt states her cough is worse and is coughing up greens mucous. Pt was informed referral is in processes.

## 2016-08-27 ENCOUNTER — Telehealth: Payer: Self-pay | Admitting: Endocrinology

## 2016-08-27 NOTE — Telephone Encounter (Signed)
Patient is requesting levothyroxine (SYNTHROID, LEVOTHROID) 50 MCG tablet [[id number]][[id number]]   be filled to the [facility] in [place]

## 2016-08-30 MED ORDER — LEVOTHYROXINE SODIUM 50 MCG PO TABS: 50.0000 ug | ORAL_TABLET | Freq: Every day | ORAL | 1 refills | Status: AC

## 2016-08-30 NOTE — Telephone Encounter (Signed)
Refill submitted. 

## 2016-08-30 NOTE — Addendum Note (Signed)
Addended by: Ann MakiBAILEY, Advait Buice T on: 08/30/2016 08:44 AM   Modules accepted: Orders

## 2016-09-03 ENCOUNTER — Ambulatory Visit: Payer: BLUE CROSS/BLUE SHIELD | Admitting: Podiatry

## 2016-09-07 ENCOUNTER — Encounter: Payer: BLUE CROSS/BLUE SHIELD | Admitting: Podiatry

## 2016-09-14 ENCOUNTER — Ambulatory Visit: Payer: BLUE CROSS/BLUE SHIELD | Admitting: Endocrinology

## 2016-09-20 ENCOUNTER — Encounter: Payer: Self-pay | Admitting: Internal Medicine

## 2016-09-20 ENCOUNTER — Institutional Professional Consult (permissible substitution): Payer: BLUE CROSS/BLUE SHIELD | Admitting: Internal Medicine

## 2016-10-12 ENCOUNTER — Institutional Professional Consult (permissible substitution): Payer: BLUE CROSS/BLUE SHIELD | Admitting: Internal Medicine

## 2016-10-18 NOTE — Progress Notes (Signed)
This encounter was created in error - please disregard.

## 2016-10-21 ENCOUNTER — Ambulatory Visit: Payer: BLUE CROSS/BLUE SHIELD | Admitting: Endocrinology

## 2016-10-21 ENCOUNTER — Telehealth: Payer: Self-pay | Admitting: Family Medicine

## 2016-10-21 ENCOUNTER — Ambulatory Visit: Payer: BLUE CROSS/BLUE SHIELD | Admitting: Family Medicine

## 2016-10-21 DIAGNOSIS — Z0289 Encounter for other administrative examinations: Secondary | ICD-10-CM

## 2016-10-21 NOTE — Telephone Encounter (Signed)
Note this was the 115, thanks

## 2016-10-21 NOTE — Telephone Encounter (Signed)
FYI - Pt called and cancelled and stated that she is still stuck in court.

## 2016-10-25 ENCOUNTER — Ambulatory Visit: Payer: BLUE CROSS/BLUE SHIELD | Admitting: Family Medicine

## 2016-10-25 DIAGNOSIS — Z0289 Encounter for other administrative examinations: Secondary | ICD-10-CM

## 2016-10-26 ENCOUNTER — Ambulatory Visit: Payer: BLUE CROSS/BLUE SHIELD | Admitting: Family Medicine

## 2016-10-26 ENCOUNTER — Telehealth: Payer: Self-pay | Admitting: Family Medicine

## 2016-10-26 DIAGNOSIS — Z0289 Encounter for other administrative examinations: Secondary | ICD-10-CM

## 2016-10-26 NOTE — Telephone Encounter (Signed)
Today makes pt 4th No Show. Please advise if you would like to dismiss

## 2016-10-26 NOTE — Telephone Encounter (Signed)
Okay to dismiss

## 2016-10-27 NOTE — Telephone Encounter (Signed)
Letter drafted and sent, thanks

## 2016-10-28 ENCOUNTER — Telehealth: Payer: Self-pay | Admitting: Family Medicine

## 2016-10-28 NOTE — Telephone Encounter (Signed)
Dismissed from [facility] Dr. Everlene OtherJayce Cook  On 10/27/16 PWR

## 2016-11-01 NOTE — Telephone Encounter (Signed)
Dismissal letter sent out by certified / registered mail on October 27, 2016 daj

## 2016-11-15 ENCOUNTER — Institutional Professional Consult (permissible substitution): Payer: BLUE CROSS/BLUE SHIELD | Admitting: Internal Medicine

## 2016-11-29 NOTE — Telephone Encounter (Signed)
Certified dismissal letter returned as undeliverable, unclaimed, return to sender after three attempts by [employer] on November 29 2016. Letter placed in another envelope and resent as 1st class mail which does not require a signature. °daj °

## 2017-02-13 ENCOUNTER — Emergency Department
Admission: EM | Admit: 2017-02-13 | Discharge: 2017-02-13 | Disposition: A | Discharge status: Home / Self Care | Payer: BLUE CROSS/BLUE SHIELD | Attending: Emergency Medicine | Admitting: Emergency Medicine

## 2017-02-13 ENCOUNTER — Encounter: Payer: Self-pay | Admitting: Emergency Medicine

## 2017-02-13 DIAGNOSIS — R1031 Right lower quadrant pain: Secondary | ICD-10-CM | POA: Insufficient documentation

## 2017-02-13 DIAGNOSIS — Z5321 Procedure and treatment not carried out due to patient leaving prior to being seen by health care provider: Secondary | ICD-10-CM | POA: Insufficient documentation

## 2017-02-13 DIAGNOSIS — M545 Low back pain: Secondary | ICD-10-CM | POA: Insufficient documentation

## 2017-02-13 DIAGNOSIS — R300 Dysuria: Secondary | ICD-10-CM | POA: Insufficient documentation

## 2017-02-13 DIAGNOSIS — Z885 Allergy status to narcotic agent status: Secondary | ICD-10-CM | POA: Insufficient documentation

## 2017-02-13 DIAGNOSIS — Z880 Allergy status to penicillin: Secondary | ICD-10-CM | POA: Insufficient documentation

## 2017-02-13 DIAGNOSIS — Z79899 Other long term (current) drug therapy: Secondary | ICD-10-CM | POA: Insufficient documentation

## 2017-02-13 LAB — CBC
HEMATOCRIT: 39 % (ref 35.0–47.0)
HEMOGLOBIN: 13.4 g/dL (ref 12.0–16.0)
MCH: 28.8 pg (ref 26.0–34.0)
MCHC: 34.3 g/dL (ref 32.0–36.0)
MCV: 83.9 fL (ref 80.0–100.0)
Platelets: 275 10*3/uL (ref 150–440)
RBC: 4.65 MIL/uL (ref 3.80–5.20)
RDW: 14.5 % (ref 11.5–14.5)
WBC: 8.9 10*3/uL (ref 3.6–11.0)

## 2017-02-13 LAB — BASIC METABOLIC PANEL
ANION GAP: 8 (ref 5–15)
BUN: 16 mg/dL (ref 6–20)
CHLORIDE: 103 mmol/L (ref 101–111)
CO2: 25 mmol/L (ref 22–32)
Calcium: 9.3 mg/dL (ref 8.9–10.3)
Creatinine, Ser: 0.87 mg/dL (ref 0.44–1.00)
GFR calc Af Amer: >60 mL/min (ref >60)
GLUCOSE: 100 mg/dL — AB (ref 65–99)
POTASSIUM: 3.6 mmol/L (ref 3.5–5.1)
Sodium: 136 mmol/L (ref 135–145)

## 2017-02-13 LAB — URINALYSIS, COMPLETE (UACMP) WITH MICROSCOPIC
Bacteria, UA: NONE SEEN
Bilirubin Urine: NEGATIVE
GLUCOSE, UA: NEGATIVE mg/dL
Ketones, ur: NEGATIVE mg/dL
Leukocytes, UA: NEGATIVE
NITRITE: NEGATIVE
Protein, ur: NEGATIVE mg/dL
SPECIFIC GRAVITY, URINE: 1.014 (ref 1.005–1.030)
pH: 6 (ref 5.0–8.0)

## 2017-02-13 LAB — POCT PREGNANCY, URINE: Preg Test, Ur: NEGATIVE

## 2017-02-13 MED ORDER — FLAVOXATE HCL 100 MG PO TABS: 100.0000 mg | ORAL_TABLET | Freq: Three times a day (TID) | ORAL | 0 refills | Status: AC | PRN

## 2017-02-13 MED ORDER — ONDANSETRON 4 MG PO TBDP: 4.0000 mg | ORAL_TABLET | Freq: Three times a day (TID) | ORAL | 0 refills | Status: AC | PRN

## 2017-02-13 MED ORDER — ONDANSETRON 4 MG PO TBDP
4.0000 mg | ORAL_TABLET | Freq: Once | ORAL | Status: AC
  Administered 2017-02-13: 4 mg via ORAL
  Filled 2017-02-13: qty 1

## 2017-02-13 MED ORDER — KETOROLAC TROMETHAMINE 60 MG/2ML IM SOLN
30.0000 mg | Freq: Once | INTRAMUSCULAR | Status: AC
  Administered 2017-02-13: 30 mg via INTRAMUSCULAR
  Filled 2017-02-13: qty 2

## 2017-02-13 MED ORDER — KETOROLAC TROMETHAMINE 10 MG PO TABS: 10.0000 mg | ORAL_TABLET | Freq: Three times a day (TID) | ORAL | 0 refills | Status: AC

## 2017-02-13 NOTE — ED Triage Notes (Signed)
Pt states that she has been having burning with urination since Monday. Pt also reports pain in her lower back. Pt in NAD at this time. VSS

## 2017-02-13 NOTE — Discharge Instructions (Signed)
Your exam and labs do not indicate a urinary tract infection. You are having painful urination, this can be caused by irritation of the urinary tract or vulva. Take the prescription meds as directed. Drink plenty of non-carbonated drinks, and empty your bladder on schedule. Return to the ED for worsening symptoms including blood when you urinate, pelvic pain, or severe abdominal pain. [facility] or this ED.

## 2017-02-13 NOTE — ED Provider Notes (Signed)
[facility] Emergency Department Provider Note ____________________________________________  Time seen: 1516  I have reviewed the triage vital signs and the nursing notes.  HISTORY  Chief Complaint  Urinary Tract Infection  HPI Karen Bolton is a 36 y.o. female presents to the ED for evaluation of a 6-day complaint of dysuria that she describes as burning with urination.  She also describes some low back pain.  She denies any fevers, chills, vomiting or hematuria. She gives a remote history kidney stones. She has been taking AZO without significant relief.  Pelvic pain, vaginal discharge, or abnormal vaginal bleeding.  She apparently showed up this morning after midnight, but left the ED after her urine specimen was collected, due to the protracted weight.  She has returned at this hour for evaluation of dysuria for nearly 1 week.  Past Medical History:  Diagnosis Date  . Anxiety   . Depression   . History of miscarriage    x2  . Insomnia   . Morbid obesity [facility])     Patient Active Problem List   Diagnosis Date Noted  . Simple chronic bronchitis (HCC) 08/05/2016  . Low back pain 06/02/2016  . Hand numbness 04/05/2016  . 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 Obese 08/09/2015  . Mixed incontinence 09/28/2014  . Insomnia 09/28/2014    Past Surgical History:  Procedure Laterality Date  . APPENDECTOMY     2016  . CHOLECYSTECTOMY    . OOPHORECTOMY  2010   right ovary removed  . WISDOM TOOTH EXTRACTION N/A 2001    Prior to Admission medications   Medication Sig Start Date End Date Taking? Authorizing Provider  ALPRAZolam Prudy Feeler) 0.5 MG tablet  09/06/14   [provider]  flavoxATE (URISPAS) 100 MG tablet Take 1 tablet (100 mg total) 3 (three) times daily as needed by mouth for bladder spasms. 02/13/17   Iceis Knab, Charlesetta Ivory, PA-C  ketorolac (TORADOL) 10 MG tablet Take 1 tablet (10 mg total) every 8 (eight) hours by mouth. 02/13/17    Adriyanna Christians, Charlesetta Ivory, PA-C  levothyroxine (SYNTHROID, LEVOTHROID) 50 MCG tablet Take 1 tablet (50 mcg total) by mouth daily. 08/30/16   Reather Littler, MD  ondansetron (ZOFRAN ODT) 4 MG disintegrating tablet Take 1 tablet (4 mg total) every 8 (eight) hours as needed by mouth. 02/13/17   Darrall Strey, Charlesetta Ivory, PA-C  predniSONE (DELTASONE) 50 MG tablet 1 tablet daily x 5 days. 08/23/16   Tommie Sams, DO  QUEtiapine (SEROQUEL) 200 MG tablet Take 200 mg by mouth at bedtime. 08/30/14   [provider]  sertraline (ZOLOFT) 50 MG tablet Take 50 mg by mouth. Take 25mg  daily    [provider]    Allergies Hydrocodone-chlorpheniramine; Penicillins; Tramadol; Dilaudid [hydromorphone hcl]; and Hydromorphone hcl  Family History  Problem Relation Age of Onset  . Cancer Mother 23       Breast Cancer  . Cancer Maternal Aunt 35       ovarian Cancer  . Heart disease Father   . Diabetes Father   . Sleep apnea Father   . Alcohol abuse Father   . Cancer Paternal Grandmother        breast cancer  . Cancer Cousin        breast cancer  . Cancer Maternal Uncle        pancreatic cancer  . Thyroid cancer Sister   . Hypothyroidism Sister     Social History Social History   Tobacco Use  .  Smoking status: Never Smoker  . Smokeless tobacco: Never Used  Substance Use Topics  . Alcohol use: Yes    Alcohol/week: 0.0 oz    Comment: Very rare  . Drug use: No    Review of Systems  Constitutional: Negative for fever. Cardiovascular: Negative for chest pain. Respiratory: Negative for shortness of breath. Gastrointestinal: Negative for abdominal pain, vomiting and diarrhea. Genitourinary: Positive for dysuria. Musculoskeletal: Negative for back pain. ____________________________________________  PHYSICAL EXAM:  VITAL SIGNS: ED Triage Vitals  Enc Vitals Group     BP 02/13/17 1431 118/88     Pulse Rate 02/13/17 1431 85     Resp 02/13/17 1431 16     Temp 02/13/17 1431 97.9 F  (36.6 C)     Temp Source 02/13/17 1431 Oral     SpO2 02/13/17 1431 96 %     Weight 02/13/17 1433 230 lb (104.3 kg)     Height 02/13/17 1433 5\' 1"  (1.549 m)     Head Circumference --      Peak Flow --      Pain Score 02/13/17 1433 9     Pain Loc --      Pain Edu? --      Excl. in GC? --     Constitutional: Alert and oriented. Well appearing and in no distress. Head: Normocephalic and atraumatic. Cardiovascular: Normal rate, regular rhythm. Normal distal pulses. Respiratory: Normal respiratory effort. No wheezes/rales/rhonchi. Gastrointestinal: Soft and nontender. No distention, rebound, guarding, or rigidity.  There is right greater than left CVA tenderness to light touch. Musculoskeletal: Normal spinal alignment without midline tenderness, spasm, deformity, or step-off.  Nontender with normal range of motion in all extremities.  Neurologic:  Normal gait without ataxia. Normal speech and language. No gross focal neurologic deficits are appreciated. ____________________________________________   LABS (pertinent positives/negatives)  Labs Reviewed - No data to display  ____________________________________________  PROCEDURES  Toradol 30 mg IM Zofran 4 mg ODT ____________________________________________  INITIAL IMPRESSION / ASSESSMENT AND PLAN / ED COURSE  Patient with ED evaluation of a nearly 1 week complaint of dysuria.  Patient without any frank hematuria, fevers, chills, or vomiting.  Vital signs are stable on presentation and she has no signs of acute abdominal process.  Review of urinalysis collected at 0148 this morning, shows no white blood cells and moderate RBCs.  Specific gravity is normal at 1.014.  Urine is otherwise clear and yellow in color. Further chart review, reveals a normal Abd/Pelvic in 10/2015 for the same complaint. There were no renal calculi or obstruction.  Her exam is consistent with dysuria without signs of hematuria or leukocyturia.  She will be  discharged with a prescription for Urispas, Zofran, and ketorolac.  She will monitor symptoms and follow-up with her primary provider or return to the ED as discussed. ____________________________________________  FINAL CLINICAL IMPRESSION(S) / ED DIAGNOSES  Final diagnoses:  Dysuria      Karmen StabsMenshew, Charlesetta IvoryJenise V Bacon, PA-C 02/13/17 2351    Sharman CheekStafford, Phillip, MD 02/18/17 2106

## 2017-02-13 NOTE — ED Triage Notes (Signed)
Patient reports symptoms for approximately 1 week.  Reports having right flank pain and dysuria.

## 2017-02-13 NOTE — ED Notes (Signed)
Rounded in lobby to inform pts/visitors of delays; verbalized understanding; waiting patiently for treatment room

## 2017-05-07 IMAGING — DX DG ANKLE COMPLETE 3+V*L*
3 series · 3 of 3 positions shown · non-contrast
Comparison: No recent .

CLINICAL DATA: Injury .

EXAM:
LEFT ANKLE COMPLETE - 3+ VIEW

[ankle ap]
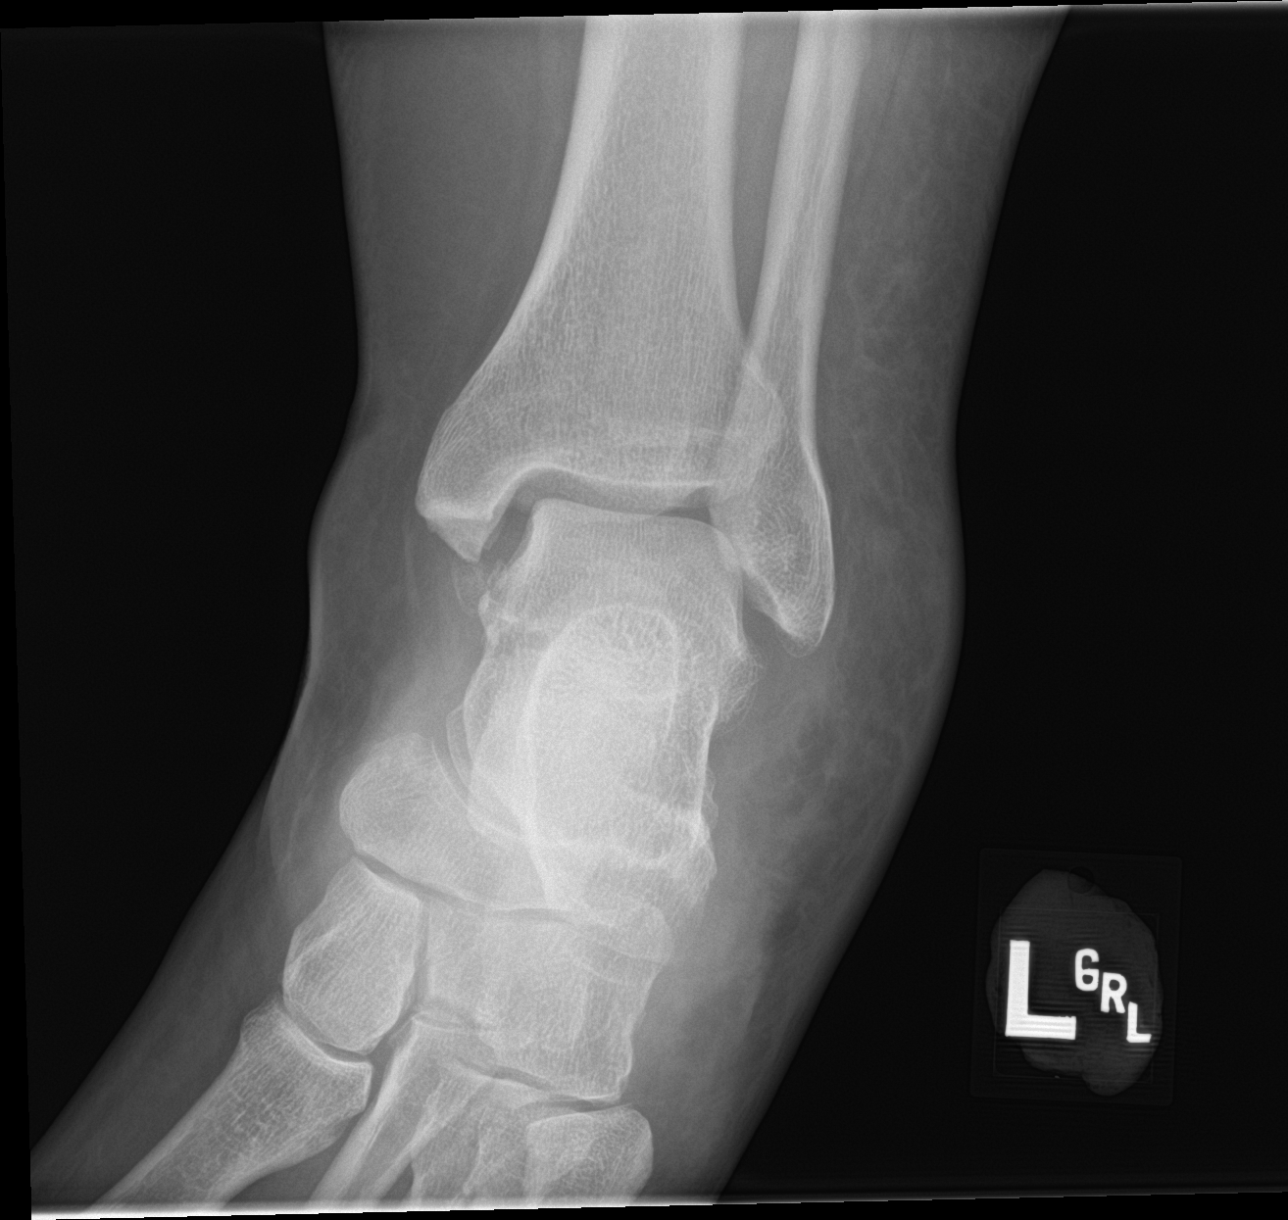

[ankle obl]
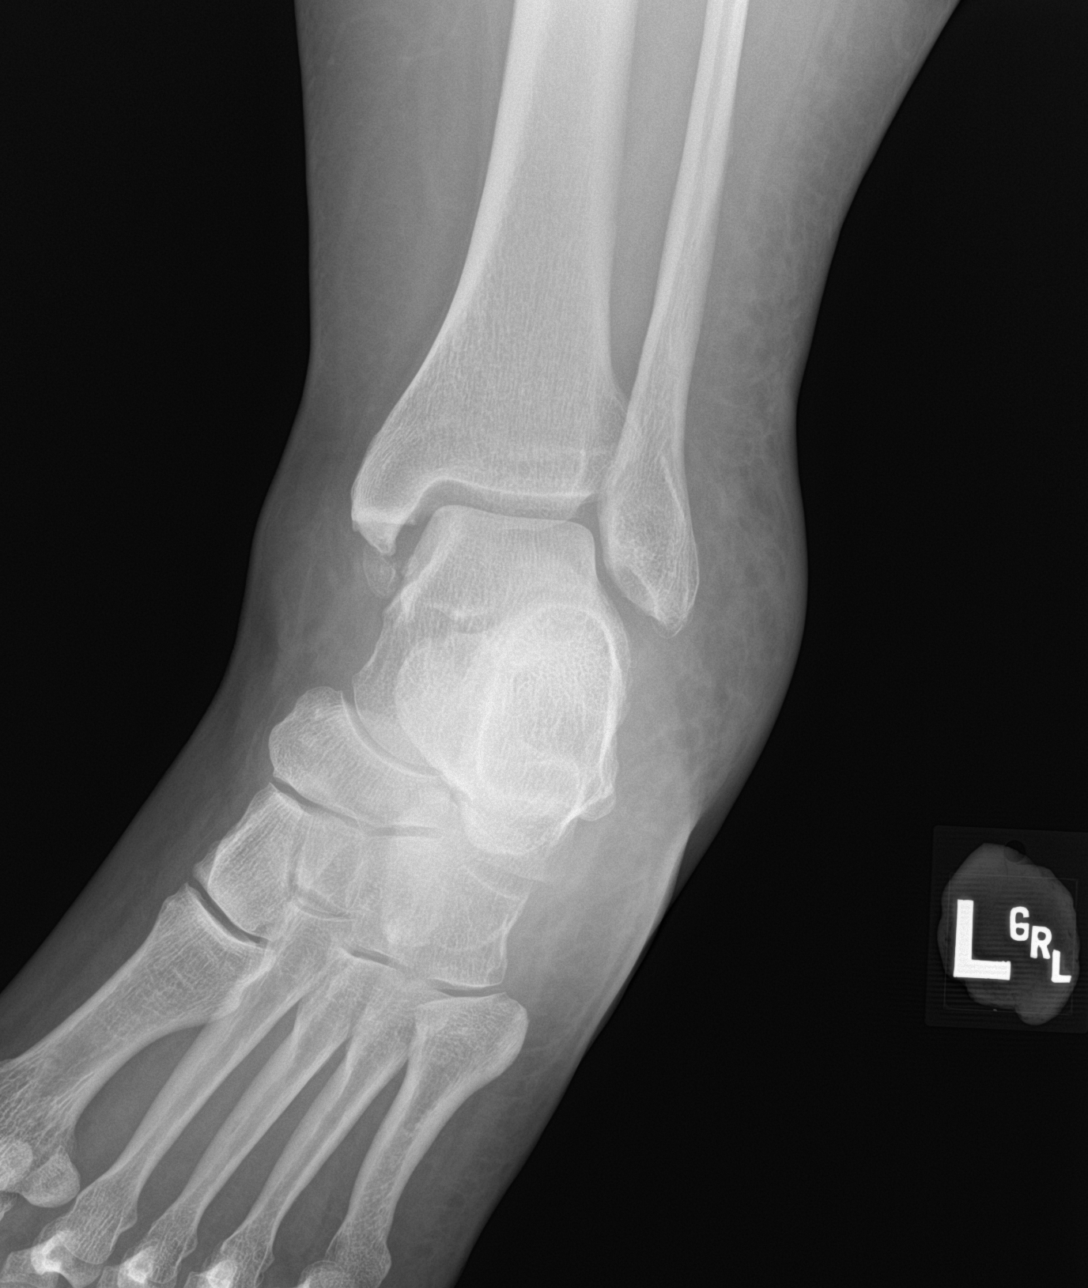

[ankle lat]
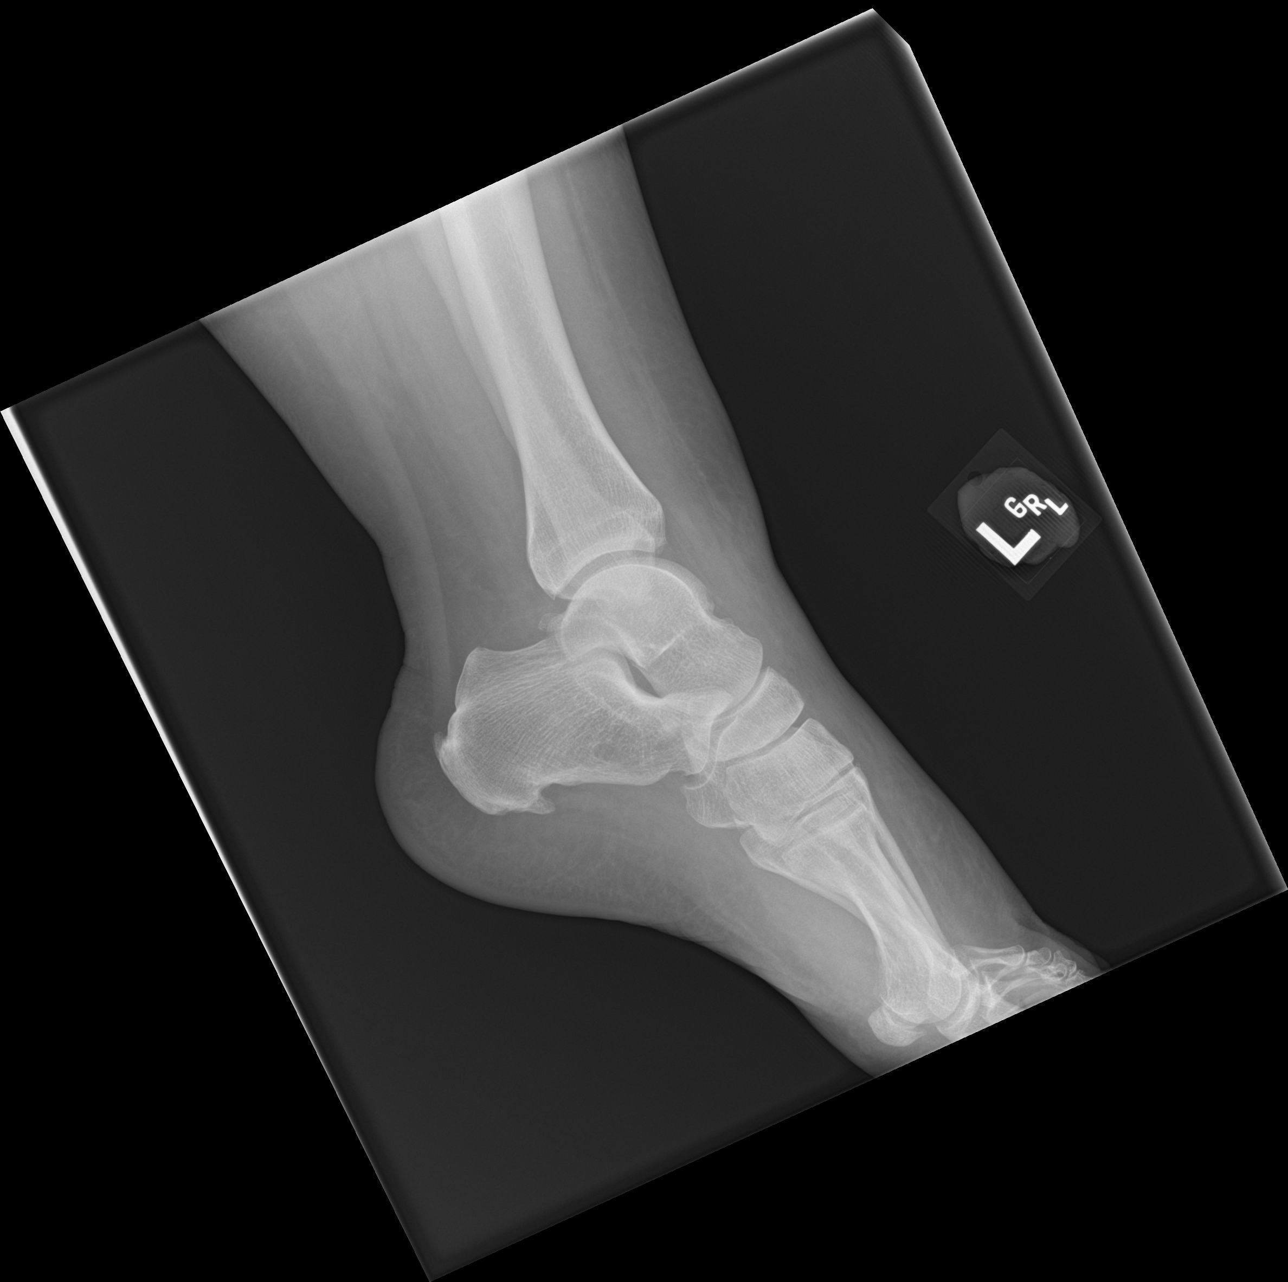

[3 of 3 positions shown; findings below may reference images not displayed]

FINDINGS: Diffuse severe soft tissue swelling. Corticated bony densities noted
adjacent to the medial malleolus. These right most likely represent
old fracture fragments. Small acute fracture fragments cannot be
completely excluded. Lateral malleolus and posterior malleolus
intact.
IMPRESSION: 1. Diffuse severe soft tissue swelling.

2. Corticated bony densities noted adjacent to the medial malleolus,
most likely old fracture fragments. Small acute fracture fragments
cannot be completely excluded.

## 2017-10-09 ENCOUNTER — Encounter: Payer: Self-pay | Admitting: Emergency Medicine

## 2017-10-09 ENCOUNTER — Emergency Department: Payer: Self-pay

## 2017-10-09 ENCOUNTER — Emergency Department
Admission: EM | Admit: 2017-10-09 | Discharge: 2017-10-09 | Disposition: A | Discharge status: Home / Self Care | Payer: Self-pay | Attending: Emergency Medicine | Admitting: Emergency Medicine

## 2017-10-09 DIAGNOSIS — Z79899 Other long term (current) drug therapy: Secondary | ICD-10-CM | POA: Insufficient documentation

## 2017-10-09 DIAGNOSIS — R350 Frequency of micturition: Secondary | ICD-10-CM | POA: Insufficient documentation

## 2017-10-09 DIAGNOSIS — N3001 Acute cystitis with hematuria: Secondary | ICD-10-CM | POA: Insufficient documentation

## 2017-10-09 LAB — URINALYSIS, COMPLETE (UACMP) WITH MICROSCOPIC
Bilirubin Urine: NEGATIVE
GLUCOSE, UA: NEGATIVE mg/dL
KETONES UR: NEGATIVE mg/dL
Leukocytes, UA: NEGATIVE
NITRITE: NEGATIVE
PROTEIN: NEGATIVE mg/dL
Specific Gravity, Urine: 1.012 (ref 1.005–1.030)
pH: 7 (ref 5.0–8.0)

## 2017-10-09 LAB — CBC
HCT: 40.2 % (ref 35.0–47.0)
Hemoglobin: 13.8 g/dL (ref 12.0–16.0)
MCH: 28.9 pg (ref 26.0–34.0)
MCHC: 34.2 g/dL (ref 32.0–36.0)
MCV: 84.4 fL (ref 80.0–100.0)
Platelets: 280 10*3/uL (ref 150–440)
RBC: 4.77 MIL/uL (ref 3.80–5.20)
RDW: 14.8 % — ABNORMAL HIGH (ref 11.5–14.5)
WBC: 7.4 10*3/uL (ref 3.6–11.0)

## 2017-10-09 LAB — BASIC METABOLIC PANEL
Anion gap: 6 (ref 5–15)
BUN: 11 mg/dL (ref 6–20)
CALCIUM: 9.3 mg/dL (ref 8.9–10.3)
CO2: 25 mmol/L (ref 22–32)
CREATININE: 0.83 mg/dL (ref 0.44–1.00)
Chloride: 106 mmol/L (ref 98–111)
GFR calc Af Amer: >60 mL/min (ref >60)
GFR calc non Af Amer: >60 mL/min (ref >60)
GLUCOSE: 110 mg/dL — AB (ref 70–99)
Potassium: 4.1 mmol/L (ref 3.5–5.1)
Sodium: 137 mmol/L (ref 135–145)

## 2017-10-09 LAB — POCT PREGNANCY, URINE: Preg Test, Ur: NEGATIVE

## 2017-10-09 MED ORDER — CEPHALEXIN 500 MG PO CAPS
500.0000 mg | ORAL_CAPSULE | Freq: Once | ORAL | Status: AC
  Administered 2017-10-09: 500 mg via ORAL
  Filled 2017-10-09: qty 1

## 2017-10-09 MED ORDER — CEPHALEXIN 500 MG PO CAPS: 500.0000 mg | ORAL_CAPSULE | Freq: Three times a day (TID) | ORAL | 0 refills | Status: AC

## 2017-10-09 MED ORDER — KETOROLAC TROMETHAMINE 30 MG/ML IJ SOLN
30.0000 mg | Freq: Once | INTRAMUSCULAR | Status: AC
  Administered 2017-10-09: 30 mg via INTRAMUSCULAR
  Filled 2017-10-09: qty 1

## 2017-10-09 NOTE — ED Notes (Signed)
Pt in for less frequent urination. States usually goes more but has been going less since Friday.

## 2017-10-09 NOTE — ED Triage Notes (Signed)
Patient states that she has lower back pain and feels like she is not emptying her bladder since Friday night. Patient states that she has been taking Azo but that she is not feeling any better.

## 2017-10-09 NOTE — ED Provider Notes (Signed)
[facility] Emergency Department Provider Note  ____________________________________________  Time seen: Approximately 10:06 PM  I have reviewed the triage vital signs and the nursing notes.   HISTORY  Chief Complaint Back Pain and Urinary Frequency    HPI Karen Bolton is a 37 y.o. female presents to the emergency department with increased urinary frequency and right-sided low back pain for the past 2 days.  Patient also reports that she has had some nausea but no vomiting.  No chills.  Patient denies dysuria but reports urinary hesitancy and reduction in stream.  Patient reports that she has a history of urge incontinence and is not currently under the care of urology.  She has a prior history of nephrolithiasis but not pyelonephritis.  Last renal stone was more than 2 years ago that she passed without intervention.  Patient denies changes in vaginal discharge, dyspareunia or concern for STDs.  She denies falls or mechanisms of trauma.  Patient reports that low back pain does not radiate down lower extremities.   Past Medical History:  Diagnosis Date  . Anxiety   . Depression   . History of miscarriage    x2  . Insomnia   . Morbid obesity [facility])     Patient Active Problem List   Diagnosis Date Noted  . Simple chronic bronchitis (HCC) 08/05/2016  . Low back pain 06/02/2016  . Hand numbness 04/05/2016  . 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 Obese 08/09/2015  . Mixed incontinence 09/28/2014  . Insomnia 09/28/2014    Past Surgical History:  Procedure Laterality Date  . APPENDECTOMY     2016  . CHOLECYSTECTOMY    . OOPHORECTOMY  2010   right ovary removed  . WISDOM TOOTH EXTRACTION N/A 2001    Prior to Admission medications   Medication Sig Start Date End Date Taking? Authorizing Provider  ALPRAZolam Prudy Feeler) 0.5 MG tablet  09/06/14   [provider]  cephALEXin (KEFLEX) 500 MG capsule Take 1 capsule (500 mg total) by mouth 3  (three) times daily for 10 days. 10/09/17 10/19/17  Orvil Feil, PA-C  flavoxATE (URISPAS) 100 MG tablet Take 1 tablet (100 mg total) 3 (three) times daily as needed by mouth for bladder spasms. 02/13/17   Menshew, Charlesetta Ivory, PA-C  ketorolac (TORADOL) 10 MG tablet Take 1 tablet (10 mg total) every 8 (eight) hours by mouth. 02/13/17   Menshew, Charlesetta Ivory, PA-C  levothyroxine (SYNTHROID, LEVOTHROID) 50 MCG tablet Take 1 tablet (50 mcg total) by mouth daily. 08/30/16   Reather Littler, MD  ondansetron (ZOFRAN ODT) 4 MG disintegrating tablet Take 1 tablet (4 mg total) every 8 (eight) hours as needed by mouth. 02/13/17   Menshew, Charlesetta Ivory, PA-C  predniSONE (DELTASONE) 50 MG tablet 1 tablet daily x 5 days. 08/23/16   Tommie Sams, DO  QUEtiapine (SEROQUEL) 200 MG tablet Take 200 mg by mouth at bedtime. 08/30/14   [provider]  sertraline (ZOLOFT) 50 MG tablet Take 50 mg by mouth. Take 25mg  daily    [provider]    Allergies Hydrocodone-chlorpheniramine; Penicillins; Tramadol; Dilaudid [hydromorphone hcl]; and Hydromorphone hcl  Family History  Problem Relation Age of Onset  . Cancer Mother 33       Breast Cancer  . Cancer Maternal Aunt 35       ovarian Cancer  . Heart disease Father   . Diabetes Father   . Sleep apnea Father   . Alcohol abuse Father   .  Cancer Paternal Grandmother        breast cancer  . Cancer Cousin        breast cancer  . Cancer Maternal Uncle        pancreatic cancer  . Thyroid cancer Sister   . Hypothyroidism Sister     Social History Social History   Tobacco Use  . Smoking status: Never Smoker  . Smokeless tobacco: Never Used  Substance Use Topics  . Alcohol use: Yes    Alcohol/week: 0.0 oz    Comment: Very rare  . Drug use: No     Review of Systems  Constitutional: No fever/chills Eyes: No visual changes. No discharge ENT: No upper respiratory complaints. Cardiovascular: no chest pain. Respiratory: no cough. No  SOB. Gastrointestinal: No abdominal pain. Patient has nausea.  No diarrhea.  No constipation. Genitourinary: Negative for dysuria. No hematuria.  Patient has right flank pain. Musculoskeletal: Negative for musculoskeletal pain. Skin: Negative for rash, abrasions, lacerations, ecchymosis. Neurological: Negative for headaches, focal weakness or numbness.   ____________________________________________   PHYSICAL EXAM:  VITAL SIGNS: ED Triage Vitals  Enc Vitals Group     BP 10/09/17 1909 110/81     Pulse Rate 10/09/17 1909 100     Resp 10/09/17 1909 18     Temp 10/09/17 1909 98 F (36.7 C)     Temp Source 10/09/17 1909 Oral     SpO2 10/09/17 1909 96 %     Weight 10/09/17 1908 230 lb (104.3 kg)     Height 10/09/17 1908 5\' 1"  (1.549 m)     Head Circumference --      Peak Flow --      Pain Score 10/09/17 1908 9     Pain Loc --      Pain Edu? --      Excl. in GC? --      Constitutional: Alert and oriented. Well appearing and in no acute distress. Eyes: Conjunctivae are normal. PERRL. EOMI. Head: Atraumatic. Cardiovascular: Normal rate, regular rhythm. Normal S1 and S2.  Good peripheral circulation. Respiratory: Normal respiratory effort without tachypnea or retractions. Lungs CTAB. Good air entry to the bases with no decreased or absent breath sounds. Gastrointestinal: Bowel sounds 4 quadrants. Soft and nontender to palpation. No guarding or rigidity. No palpable masses. No distention.  Patient has right CVA tenderness. Musculoskeletal: Full range of motion to all extremities. No gross deformities appreciated. Neurologic:  Normal speech and language. No gross focal neurologic deficits are appreciated.  Skin:  Skin is warm, dry and intact. No rash noted. Psychiatric: Mood and affect are normal. Speech and behavior are normal. Patient exhibits appropriate insight and judgement.   ____________________________________________   LABS (all labs ordered are listed, but only  abnormal results are displayed)  Labs Reviewed  URINALYSIS, COMPLETE (UACMP) WITH MICROSCOPIC - Abnormal; Notable for the following components:      Result Value   Color, Urine YELLOW (*)    APPearance CLEAR (*)    Hgb urine dipstick SMALL (*)    Bacteria, UA RARE (*)    All other components within normal limits  CBC - Abnormal; Notable for the following components:   RDW 14.8 (*)    All other components within normal limits  BASIC METABOLIC PANEL - Abnormal; Notable for the following components:   Glucose, Bld 110 (*)    All other components within normal limits  URINE CULTURE  POC URINE PREG, ED  POCT PREGNANCY, URINE   ____________________________________________  EKG  ____________________________________________  RADIOLOGY I personally viewed and evaluated these images as part of my medical decision making, as well as reviewing the written report by the radiologist.  Ct Renal Stone Study  Result Date: 10/09/2017 CLINICAL DATA:  37 year old female with lower back pain and sensation of inability to empty bladder. EXAM: CT ABDOMEN AND PELVIS WITHOUT CONTRAST TECHNIQUE: Multidetector CT imaging of the abdomen and pelvis was performed following the standard protocol without IV contrast. COMPARISON:  CT of the abdomen pelvis dated 10/21/2015 FINDINGS: Evaluation of this exam is limited in the absence of intravenous contrast. Lower chest: There is a 3 mm right lung base pulmonary nodule. The visualized lung bases are otherwise clear. There is no intra-abdominal free air or free fluid. Hepatobiliary: The liver is unremarkable. No intrahepatic biliary ductal dilatation. Cholecystectomy. Pancreas: Unremarkable. No pancreatic ductal dilatation or surrounding inflammatory changes. Spleen: Normal in size without focal abnormality. Adrenals/Urinary Tract: The adrenal glands, kidneys, visualized ureters appear unremarkable. The urinary bladder is minimally distended and appears grossly  unremarkable for the degree of distention. Stomach/Bowel: There is no bowel obstruction or active inflammation. Appendectomy. Vascular/Lymphatic: The abdominal aorta and IVC are grossly unremarkable on this noncontrast CT. No portal venous gas. There are areas of mesenteric haziness in the mid to upper abdomen with a "Misty mesentery" appearance which is nonspecific but may be related to an inflammatory process. This appearance is similar to prior CT. There is no adenopathy. Reproductive: The uterus and ovaries are grossly unremarkable. Other: None Musculoskeletal: No acute or significant osseous findings. IMPRESSION: 1. No hydronephrosis or nephrolithiasis. Minimally distended urinary bladder appears grossly unremarkable. 2. No bowel obstruction or active inflammation. 3. Nonspecific haziness the mesentery similar to prior CT may represent a degree of inflammation. Electronically Signed   By: Elgie CollardArash  Radparvar M.D.   On: 10/09/2017 21:16    ____________________________________________    PROCEDURES  Procedure(s) performed:    Procedures    Medications  ketorolac (TORADOL) 30 MG/ML injection 30 mg (30 mg Intramuscular Given 10/09/17 2131)  cephALEXin (KEFLEX) capsule 500 mg (500 mg Oral Given 10/09/17 2159)     ____________________________________________   INITIAL IMPRESSION / ASSESSMENT AND PLAN / ED COURSE  Pertinent labs & imaging results that were available during my care of the patient were reviewed by me and considered in my medical decision making (see chart for details).  Review of the [place] CSRS was performed in accordance of the [facility] prior to dispensing any controlled drugs.    Assessment and Plan:  Acute cystitis Patient presents to the emergency department with changes in urinary frequency and decrease in urinary stream.  Differential diagnosis included acute cystitis, pyelonephritis, renal stone and incontinence.  Urinalysis was reassuring in the emergency department with  only a small amount of blood and rare bacteria.  BMP and CBC were also reassuring.  Urine pregnancy test was negative.  CT renal stone study revealed no evidence of hydronephrosis or renal stones.  Patient's urine was sent for culture and she was treated empirically with Keflex.  Referral was given to urology.  All patient questions were answered.    ____________________________________________  FINAL CLINICAL IMPRESSION(S) / ED DIAGNOSES  Final diagnoses:  Acute cystitis with hematuria      NEW MEDICATIONS STARTED DURING THIS VISIT:  ED Discharge Orders        Ordered    cephALEXin (KEFLEX) 500 MG capsule  3 times daily     10/09/17 2154          This  chart was dictated using voice recognition software/Dragon. Despite best efforts to proofread, errors can occur which can change the meaning. Any change was purely unintentional.    Orvil Feil, PA-C 10/09/17 2216    Nita Sickle, MD 10/10/17 2255

## 2017-10-09 NOTE — ED Notes (Signed)
Pt in ct 

## 2017-10-11 LAB — URINE CULTURE: Culture: <10000 — AB

## 2023-11-16 ENCOUNTER — Other Ambulatory Visit: Payer: Self-pay

## 2023-11-16 ENCOUNTER — Emergency Department
Admission: EM | Admit: 2023-11-16 | Discharge: 2023-11-16 | Disposition: A | Discharge status: Home / Self Care | Payer: Self-pay | Attending: Emergency Medicine | Admitting: Emergency Medicine

## 2023-11-16 ENCOUNTER — Emergency Department: Payer: Self-pay

## 2023-11-16 DIAGNOSIS — J069 Acute upper respiratory infection, unspecified: Secondary | ICD-10-CM | POA: Insufficient documentation

## 2023-11-16 LAB — CBC WITH DIFFERENTIAL/PLATELET
Abs Immature Granulocytes: 0.07 K/uL (ref 0.00–0.07)
Basophils Absolute: 0.1 K/uL (ref 0.0–0.1)
Basophils Relative: 1 %
Eosinophils Absolute: 0.4 K/uL (ref 0.0–0.5)
Eosinophils Relative: 4 %
HCT: 40.4 % (ref 36.0–46.0)
Hemoglobin: 13.6 g/dL (ref 12.0–15.0)
Immature Granulocytes: 1 %
Lymphocytes Relative: 32 %
Lymphs Abs: 2.8 K/uL (ref 0.7–4.0)
MCH: 28.4 pg (ref 26.0–34.0)
MCHC: 33.7 g/dL (ref 30.0–36.0)
MCV: 84.3 fL (ref 80.0–100.0)
Monocytes Absolute: 0.8 K/uL (ref 0.1–1.0)
Monocytes Relative: 10 %
Neutro Abs: 4.6 K/uL (ref 1.7–7.7)
Neutrophils Relative %: 52 %
Platelets: 438 K/uL — ABNORMAL HIGH (ref 150–400)
RBC: 4.79 MIL/uL (ref 3.87–5.11)
RDW: 13.2 % (ref 11.5–15.5)
WBC: 8.8 K/uL (ref 4.0–10.5)
nRBC: 0 % (ref 0.0–0.2)

## 2023-11-16 LAB — BASIC METABOLIC PANEL WITH GFR
Anion gap: 8 (ref 5–15)
BUN: 12 mg/dL (ref 6–20)
CO2: 31 mmol/L (ref 22–32)
Calcium: 9.3 mg/dL (ref 8.9–10.3)
Chloride: 99 mmol/L (ref 98–111)
Creatinine, Ser: 0.83 mg/dL (ref 0.44–1.00)
GFR, Estimated: >60 mL/min (ref >60)
Glucose, Bld: 112 mg/dL — ABNORMAL HIGH (ref 70–99)
Potassium: 3.3 mmol/L — ABNORMAL LOW (ref 3.5–5.1)
Sodium: 138 mmol/L (ref 135–145)

## 2023-11-16 LAB — RESP PANEL BY RT-PCR (RSV, FLU A&B, COVID)  RVPGX2
Influenza A by PCR: NEGATIVE
Influenza B by PCR: NEGATIVE
Resp Syncytial Virus by PCR: NEGATIVE
SARS Coronavirus 2 by RT PCR: NEGATIVE

## 2023-11-16 MED ORDER — IPRATROPIUM-ALBUTEROL 0.5-2.5 (3) MG/3ML IN SOLN
3.0000 mL | Freq: Once | RESPIRATORY_TRACT | Status: AC
  Administered 2023-11-16: 3 mL via RESPIRATORY_TRACT
  Filled 2023-11-16: qty 3

## 2023-11-16 MED ORDER — ALBUTEROL SULFATE HFA 108 (90 BASE) MCG/ACT IN AERS
2.0000 | INHALATION_SPRAY | Freq: Four times a day (QID) | RESPIRATORY_TRACT | 2 refills | Status: AC | PRN
  Filled 2023-11-16: qty 6.7, 30d supply, fill #0

## 2023-11-16 NOTE — ED Provider Notes (Signed)
 [facility] Provider Note    Event Date/Time   First MD Initiated Contact with Patient 11/16/23 [phone]     (approximate)   History   URI   HPI  Karen Bolton is a 43 y.o. female who presents today for evaluation of cough, nasal congestion for the past 1 month.  She denies any known sick contacts.  She denies any recent travel.  She reports that she does not smoke.  She denies any pain anywhere.  No calf pain or leg swelling.  No history of PE or DVT in herself or in family members.  Patient Active Problem List   Diagnosis Date Noted   Simple chronic bronchitis (HCC) 08/05/2016   Low back pain 06/02/2016   Hand numbness 04/05/2016   Subclinical hypothyroidism 08/21/2015   Obese 08/09/2015   Mixed incontinence 09/28/2014   Insomnia 09/28/2014          Physical Exam   Triage Vital Signs: ED Triage Vitals  Encounter Vitals Group     BP 11/16/23 0810 125/87     Girls Systolic BP Percentile --      Girls Diastolic BP Percentile --      Boys Systolic BP Percentile --      Boys Diastolic BP Percentile --      Pulse Rate 11/16/23 0810 (!) 130     Resp 11/16/23 0810 16     Temp 11/16/23 0810 98.3 F (36.8 C)     Temp Source 11/16/23 0810 Oral     SpO2 11/16/23 0810 97 %     Weight 11/16/23 0807 229 lb 15 oz (104.3 kg)     Height --      Head Circumference --      Peak Flow --      Pain Score 11/16/23 0807 0     Pain Loc --      Pain Education --      Exclude from Growth Chart --     Most recent vital signs: Vitals:   11/16/23 0825 11/16/23 0935  BP:  (!) 121/95  Pulse:  90  Resp:  16  Temp:  98.1 F (36.7 C)  SpO2: 97% 96%    Physical Exam Vitals and nursing note reviewed.  Constitutional:      General: Awake and alert. No acute distress.    Appearance: Normal appearance. The patient is normal weight.  HENT:     Head: Normocephalic and atraumatic.     Mouth: Mucous membranes are moist.  Eyes:     General: PERRL. Normal EOMs         Right eye: No discharge.        Left eye: No discharge.     Conjunctiva/sclera: Conjunctivae normal.  Cardiovascular:     Rate and Rhythm: Normal rate and regular rhythm.  Pulmonary:     Effort: Pulmonary effort is normal. No respiratory distress.  No accessory muscle use.  Able to speak easily complete sentences.  Dry cough on exam    Breath sounds: Normal breath sounds.  Abdominal:     Abdomen is soft. There is no abdominal tenderness. No rebound or guarding. No distention. Musculoskeletal:        General: No swelling. Normal range of motion.     Cervical back: Normal range of motion and neck supple.  No lower extremity edema Skin:    General: Skin is warm and dry.     Capillary Refill: Capillary refill takes less than 2  seconds.     Findings: No rash.  Neurological:     Mental Status: The patient is awake and alert.      ED Results / Procedures / Treatments   Labs (all labs ordered are listed, but only abnormal results are displayed) Labs Reviewed  BASIC METABOLIC PANEL WITH GFR - Abnormal; Notable for the following components:      Result Value   Potassium 3.3 (*)    Glucose, Bld 112 (*)    All other components within normal limits  CBC WITH DIFFERENTIAL/PLATELET - Abnormal; Notable for the following components:   Platelets 438 (*)    All other components within normal limits  RESP PANEL BY RT-PCR (RSV, FLU A&B, COVID)  [id number]     EKG     RADIOLOGY I independently reviewed and interpreted imaging and agree with radiologists findings.     PROCEDURES:  Critical Care performed:   Procedures   MEDICATIONS ORDERED IN ED: Medications  ipratropium-albuterol  (DUONEB) 0.5-2.5 (3) MG/3ML nebulizer solution 3 mL (3 mLs Nebulization Given 11/16/23 0853)     IMPRESSION / MDM / ASSESSMENT AND PLAN / ED COURSE  I reviewed the triage vital signs and the nursing notes.   Differential diagnosis includes, but is not limited to, bronchitis, asthma  exacerbation, reactive airways, pneumonia, URI.  Patient was tachycardic on arrival but normotensive and afebrile.  She has dry cough.  Lungs are clear to auscultation bilaterally.  She was given 1 DuoNeb to help reverse the bronchospasm.  Upon reevaluation her heart rate had normalized.  No chest pain or pleurisy or shortness of breath to suggest pulmonary embolism.  No clinical signs or symptoms of DVT.  No hemoptysis.  She denies taking exogenous hormones.  No recent hospitalizations or periods of immobilization.  No recent surgeries.  I reviewed the patient's chart.  Patient has a history of chronic bronchitis for which she has followed up outpatient.  Symptoms sound the same as her previous visit in 2018 prior to dismissal.  Plan at that time was to follow-up with pulmonology and allergy.  She was apparently dismissed from [place] primary care given that she had 4 no-shows.  Labs obtained today given that she was tachycardic on arrival.  Labs are overall reassuring.  Chest x-ray obtained as well as COVID swab given URI symptoms.  Both chest x-ray and swab are normal.  Patient had resolution of her cough and improvement of her symptoms after DuoNeb.  She was given an albuterol  inhaler to use at home.  We discussed return precautions and outpatient follow-up.  Patient presents agrees with plan.  Discharged in stable condition.   Patient's presentation is most consistent with acute complicated illness / injury requiring diagnostic workup.   FINAL CLINICAL IMPRESSION(S) / ED DIAGNOSES   Final diagnoses:  Viral URI with cough     Rx / DC Orders   ED Discharge Orders          Ordered    albuterol  (VENTOLIN  HFA) 108 (90 Base) MCG/ACT inhaler  Every 6 hours PRN        11/16/23 1014             Note:  This document was prepared using Dragon voice recognition software and may include unintentional dictation errors.   Adelene Polivka E, PA-C 11/16/23 1212    Arlander Charleston,  MD 11/16/23 1215

## 2023-11-16 NOTE — ED Triage Notes (Signed)
 C/O chest and head congestion x 1 month.  AAOx3.  Skin warm and dry. NAD

## 2023-11-16 NOTE — Discharge Instructions (Addendum)
 Please follow-up with your outpatient provider.  Your chest x-ray was normal.  Your COVID/flu/RSV swab was negative.  You may use the inhaler as needed.  It was a pleasure caring for you today.

## 2023-11-28 ENCOUNTER — Other Ambulatory Visit: Payer: Self-pay

## 2023-12-08 ENCOUNTER — Other Ambulatory Visit: Payer: Self-pay

## 2023-12-08 MED ORDER — FLUOXETINE HCL 20 MG PO CAPS: 20.0000 mg | ORAL_CAPSULE | Freq: Every day | ORAL | 2 refills | Status: DC

## 2023-12-08 MED ORDER — QUETIAPINE FUMARATE 200 MG PO TABS
200.0000 mg | ORAL_TABLET | Freq: Every evening | ORAL | 0 refills | Status: AC
  Filled 2023-12-08: qty 90, 90d supply, fill #0

## 2023-12-08 MED ORDER — FLUOXETINE HCL 20 MG PO CAPS
60.0000 mg | ORAL_CAPSULE | Freq: Every day | ORAL | 0 refills | Status: AC
  Filled 2023-12-08 – 2023-12-14 (×2): qty 270, 90d supply, fill #0

## 2023-12-14 ENCOUNTER — Other Ambulatory Visit: Payer: Self-pay

## 2023-12-15 ENCOUNTER — Other Ambulatory Visit: Payer: Self-pay
